# Patient Record
Sex: Male | Born: 1960 | Race: White | Hispanic: No | Marital: Single | Smoking: Current every day smoker
Health system: Southern US, Community
[De-identification: ages and names within clinical notes are randomized; demographics above are authoritative.]

## PROBLEM LIST (undated history)

## (undated) DIAGNOSIS — K219 Gastro-esophageal reflux disease without esophagitis: Secondary | ICD-10-CM

## (undated) DIAGNOSIS — Z973 Presence of spectacles and contact lenses: Secondary | ICD-10-CM

## (undated) DIAGNOSIS — J42 Unspecified chronic bronchitis: Secondary | ICD-10-CM

## (undated) DIAGNOSIS — J439 Emphysema, unspecified: Secondary | ICD-10-CM

## (undated) DIAGNOSIS — C801 Malignant (primary) neoplasm, unspecified: Secondary | ICD-10-CM

## (undated) HISTORY — DX: Malignant (primary) neoplasm, unspecified: C80.1

## (undated) HISTORY — PX: TONSILLECTOMY: SUR1361

## (undated) HISTORY — DX: Emphysema, unspecified: J43.9

## (undated) HISTORY — PX: CERVICAL DISCECTOMY: SHX98

## (undated) HISTORY — PX: COLONOSCOPY: SHX174

---

## 2005-08-03 ENCOUNTER — Ambulatory Visit: Payer: Self-pay

## 2012-12-19 ENCOUNTER — Emergency Department: Payer: Self-pay | Admitting: Emergency Medicine

## 2012-12-19 LAB — COMPREHENSIVE METABOLIC PANEL
BUN: 12 mg/dL (ref 7–18)
Bilirubin,Total: 0.7 mg/dL (ref 0.2–1.0)
Calcium, Total: 8.7 mg/dL (ref 8.5–10.1)
Chloride: 102 mmol/L (ref 98–107)
Co2: 24 mmol/L (ref 21–32)
Creatinine: 0.83 mg/dL (ref 0.60–1.30)
EGFR (Non-African Amer.): >60
Glucose: 88 mg/dL (ref 65–99)
Osmolality: 266 (ref 275–301)
Potassium: 3.6 mmol/L (ref 3.5–5.1)
Total Protein: 7.7 g/dL (ref 6.4–8.2)

## 2012-12-19 LAB — CBC
HCT: 45.2 % (ref 40.0–52.0)
HGB: 16.3 g/dL (ref 13.0–18.0)
MCH: 33.1 pg (ref 26.0–34.0)
MCHC: 35.9 g/dL (ref 32.0–36.0)
MCV: 92 fL (ref 80–100)
Platelet: 215 10*3/uL (ref 150–440)
RBC: 4.91 10*6/uL (ref 4.40–5.90)
RDW: 13.2 % (ref 11.5–14.5)

## 2012-12-19 LAB — URINALYSIS, COMPLETE
Bacteria: NONE SEEN
Bilirubin,UR: NEGATIVE
Blood: NEGATIVE
Glucose,UR: NEGATIVE mg/dL (ref 0–75)
Hyaline Cast: 1
Leukocyte Esterase: NEGATIVE
Nitrite: NEGATIVE
Ph: 6 (ref 4.5–8.0)
Protein: NEGATIVE
RBC,UR: 1 /HPF (ref 0–5)
Specific Gravity: 1.02 (ref 1.003–1.030)
Squamous Epithelial: <1
WBC UR: 1 /HPF (ref 0–5)

## 2013-12-05 ENCOUNTER — Emergency Department: Payer: Self-pay | Admitting: Emergency Medicine

## 2014-01-24 ENCOUNTER — Emergency Department: Payer: Self-pay | Admitting: Emergency Medicine

## 2014-03-01 ENCOUNTER — Ambulatory Visit: Payer: Self-pay | Admitting: Unknown Physician Specialty

## 2014-03-06 LAB — PATHOLOGY REPORT

## 2014-09-05 ENCOUNTER — Other Ambulatory Visit (INDEPENDENT_AMBULATORY_CARE_PROVIDER_SITE_OTHER): Payer: Self-pay | Admitting: Surgery

## 2014-09-16 ENCOUNTER — Encounter (HOSPITAL_BASED_OUTPATIENT_CLINIC_OR_DEPARTMENT_OTHER): Payer: Self-pay | Admitting: *Deleted

## 2014-09-16 NOTE — Progress Notes (Signed)
No labs per anesthesia needed 

## 2014-09-17 NOTE — H&P (Addendum)
Ian Taylor. Gluth 09/05/2014 3:41 PM Location: [facility] Patient #: [id number] DOB: [birth year] Single / Language: [place] / Race: White Male  History of Present Illness Ian Lam A. Magnus Ivan MD; 09/05/2014 4:07 PM) Patient words: hernia.  The patient is a 54 year old male who presents with an inguinal hernia. This gentleman is referred by Dr. Eula Fried for evaluation of a left inguinal hernia. The patient started having pain in his left groin in December 2015. In January, he started having a reducible bulge in the left groin. He has moderate discomfort in the left groin especially after standing all day. The pain is described as sharp. He has had no obstructive symptoms and reports that the hernia will easily reduced. He has no other complaints.   Other Problems Ian Taylor, [place]; 09/05/2014 3:41 PM) Gastroesophageal Reflux Disease  Past Surgical History Ian Taylor, CMA; 09/05/2014 3:41 PM) Tonsillectomy  Diagnostic Studies History Ian Taylor, [place]; 09/05/2014 3:41 PM) Colonoscopy within last year  Allergies Ian Taylor, CMA; 09/05/2014 3:41 PM) No Known Drug Allergies02/18/2016  Medication History Ian Taylor, CMA; 09/05/2014 3:43 PM) Ventolin HFA (108 (90 Base)MCG/ACT Aerosol Soln, Inhalation) Active. Aleve (  Capsule, Oral prn) Active. Probiotic (Oral) Active. Acid Reducer (  Tablet, Oral) Active.  Social History Ian Taylor, [place]; 09/05/2014 3:41 PM) Alcohol use Moderate alcohol use. Caffeine use Tea. Illicit drug use Remotely quit drug use. Tobacco use Current every day smoker.  Family History Ian Taylor, [place]; 09/05/2014 3:41 PM) Family history unknown First Degree Relatives  Review of Systems Ian Taylor CMA; 09/05/2014 3:41 PM) General Not Present- Appetite Loss, Chills, Fatigue, Fever, Night Sweats, Weight Gain and Weight Loss. Skin Not Present- Change in Wart/Mole, Dryness, Hives,  Jaundice, New Lesions, Non-Healing Wounds, Rash and Ulcer. HEENT Not Present- Earache, Hearing Loss, Hoarseness, Nose Bleed, Oral Ulcers, Ringing in the Ears, Seasonal Allergies, Sinus Pain, Sore Throat, Visual Disturbances, Wears glasses/contact lenses and Yellow Eyes. Respiratory Not Present- Bloody sputum, Chronic Cough, Difficulty Breathing, Snoring and Wheezing. Breast Not Present- Breast Mass, Breast Pain, Nipple Discharge and Skin Changes. Cardiovascular Not Present- Chest Pain, Difficulty Breathing Lying Down, Leg Cramps, Palpitations, Rapid Heart Rate, Shortness of Breath and Swelling of Extremities. Gastrointestinal Not Present- Abdominal Pain, Bloating, Bloody Stool, Change in Bowel Habits, Chronic diarrhea, Constipation, Difficulty Swallowing, Excessive gas, Gets full quickly at meals, Hemorrhoids, Indigestion, Nausea, Rectal Pain and Vomiting. Male Genitourinary Not Present- Blood in Urine, Change in Urinary Stream, Frequency, Impotence, Nocturia, Painful Urination, Urgency and Urine Leakage.   Vitals [employer] R. Taylor CMA; 09/05/2014 3:41 PM) 09/05/2014 3:41 PM Weight: 145.38 lb Height: 68in Body Surface Area: 1.78 m Body Mass Index: 22.1 kg/m BP: 108/70 (Sitting, Left Arm, Standard)    Physical Exam (Ian Niblett A. Magnus Ivan MD; 09/05/2014 4:07 PM) General Mental Status-Alert. General Appearance-Consistent with stated age. Hydration-Well hydrated. Voice-Normal.  Head and Neck Head-normocephalic, atraumatic with no lesions or palpable masses. Trachea-midline.  Eye Eyeball - Bilateral-Extraocular movements intact. Sclera/Conjunctiva - Bilateral-No scleral icterus.  Chest and Lung Exam Chest and lung exam reveals -quiet, even and easy respiratory effort with no use of accessory muscles and on auscultation, normal breath sounds, no adventitious sounds and normal vocal resonance. Inspection Chest Wall - Normal. Back -  normal.  Cardiovascular Cardiovascular examination reveals -normal heart sounds, regular rate and rhythm with no murmurs and normal pedal pulses bilaterally.  Abdomen Inspection Skin - Scar - no surgical scars. Hernias - Inguinal hernia - Left - Reducible. Palpation/Percussion Palpation and  Percussion of the abdomen reveal - Soft, Non Tender, No Rebound tenderness, No Rigidity (guarding) and No hepatosplenomegaly. Auscultation Auscultation of the abdomen reveals - Bowel sounds normal.  Neurologic Neurologic evaluation reveals -alert and oriented x 3 with no impairment of recent or remote memory. Mental Status-Normal.  Musculoskeletal Normal Exam - Left-Upper Extremity Strength Normal and Lower Extremity Strength Normal. Normal Exam - Right-Upper Extremity Strength Normal, Lower Extremity Weakness.    Assessment & Plan (Tonnya Garbett A. Magnus IvanBlackman MD; 09/05/2014 4:09 PM) LEFT INGUINAL HERNIA (550.90  K40.90) Impression: I discussed the diagnosis with the patient in detail. He actually also feels weak in the right groin. I would recommend laparoscopic left inguinal hernia repair with mesh which would allow me to evaluate the right inguinal side and repaired as well if there is a hernia. I discussed the risk of surgery which includes but is not limited to bleeding, infection, injury to surrounding structures, nerve entrapment, chronic pain, etc. He understands and wished to proceed with surgery     Signed by Ian Rubensteinouglas A Vannessa Godown, MD (09/05/2014 4:13 PM)

## 2014-09-18 ENCOUNTER — Encounter (HOSPITAL_BASED_OUTPATIENT_CLINIC_OR_DEPARTMENT_OTHER): Payer: Self-pay | Admitting: Certified Registered"

## 2014-09-18 ENCOUNTER — Ambulatory Visit (HOSPITAL_BASED_OUTPATIENT_CLINIC_OR_DEPARTMENT_OTHER): Payer: BLUE CROSS/BLUE SHIELD | Admitting: Certified Registered"

## 2014-09-18 ENCOUNTER — Ambulatory Visit (HOSPITAL_BASED_OUTPATIENT_CLINIC_OR_DEPARTMENT_OTHER)
Admission: RE | Admit: 2014-09-18 | Discharge: 2014-09-18 | Disposition: A | Discharge status: Home / Self Care | Payer: BLUE CROSS/BLUE SHIELD | Source: Ambulatory Visit | Attending: Surgery | Admitting: Surgery

## 2014-09-18 ENCOUNTER — Encounter (HOSPITAL_BASED_OUTPATIENT_CLINIC_OR_DEPARTMENT_OTHER)
Admission: RE | Disposition: A | Discharge status: Home / Self Care | Payer: Self-pay | Source: Ambulatory Visit | Attending: Surgery

## 2014-09-18 DIAGNOSIS — Z79899 Other long term (current) drug therapy: Secondary | ICD-10-CM | POA: Insufficient documentation

## 2014-09-18 DIAGNOSIS — Z791 Long term (current) use of non-steroidal anti-inflammatories (NSAID): Secondary | ICD-10-CM | POA: Insufficient documentation

## 2014-09-18 DIAGNOSIS — Z9089 Acquired absence of other organs: Secondary | ICD-10-CM | POA: Diagnosis not present

## 2014-09-18 DIAGNOSIS — K219 Gastro-esophageal reflux disease without esophagitis: Secondary | ICD-10-CM | POA: Insufficient documentation

## 2014-09-18 DIAGNOSIS — K4090 Unilateral inguinal hernia, without obstruction or gangrene, not specified as recurrent: Secondary | ICD-10-CM | POA: Insufficient documentation

## 2014-09-18 DIAGNOSIS — F17200 Nicotine dependence, unspecified, uncomplicated: Secondary | ICD-10-CM | POA: Diagnosis not present

## 2014-09-18 DIAGNOSIS — F17210 Nicotine dependence, cigarettes, uncomplicated: Secondary | ICD-10-CM | POA: Diagnosis not present

## 2014-09-18 HISTORY — DX: Unspecified chronic bronchitis: J42

## 2014-09-18 HISTORY — DX: Gastro-esophageal reflux disease without esophagitis: K21.9

## 2014-09-18 HISTORY — PX: INSERTION OF MESH: SHX5868

## 2014-09-18 HISTORY — PX: INGUINAL HERNIA REPAIR: SHX194

## 2014-09-18 HISTORY — DX: Presence of spectacles and contact lenses: Z97.3

## 2014-09-18 LAB — POCT HEMOGLOBIN-HEMACUE: HEMOGLOBIN: 16.8 g/dL (ref 13.0–17.0)

## 2014-09-18 SURGERY — REPAIR, HERNIA, INGUINAL, LAPAROSCOPIC
Anesthesia: General | Site: Abdomen

## 2014-09-18 MED ORDER — OXYCODONE HCL 5 MG PO TABS
5.0000 mg | ORAL_TABLET | ORAL | Status: DC | PRN
  Administered 2014-09-18: 5 mg via ORAL

## 2014-09-18 MED ORDER — FENTANYL CITRATE 0.05 MG/ML IJ SOLN: 25.0000 ug | INTRAMUSCULAR | Status: DC | PRN

## 2014-09-18 MED ORDER — DEXAMETHASONE SODIUM PHOSPHATE 4 MG/ML IJ SOLN
INTRAMUSCULAR | Status: DC | PRN
  Administered 2014-09-18: 10 mg via INTRAVENOUS

## 2014-09-18 MED ORDER — MIDAZOLAM HCL 2 MG/2ML IJ SOLN
INTRAMUSCULAR | Status: AC
  Filled 2014-09-18: qty 2

## 2014-09-18 MED ORDER — MIDAZOLAM HCL 2 MG/2ML IJ SOLN: 1.0000 mg | INTRAMUSCULAR | Status: DC | PRN

## 2014-09-18 MED ORDER — FENTANYL CITRATE 0.05 MG/ML IJ SOLN
INTRAMUSCULAR | Status: AC
  Filled 2014-09-18: qty 2

## 2014-09-18 MED ORDER — OXYCODONE-ACETAMINOPHEN 5-325 MG PO TABS: 1.0000 | ORAL_TABLET | ORAL | Status: DC | PRN

## 2014-09-18 MED ORDER — GLYCOPYRROLATE 0.2 MG/ML IJ SOLN
INTRAMUSCULAR | Status: DC | PRN
  Administered 2014-09-18: 0.6 mg via INTRAVENOUS

## 2014-09-18 MED ORDER — PROMETHAZINE HCL 25 MG/ML IJ SOLN: 6.2500 mg | INTRAMUSCULAR | Status: DC | PRN

## 2014-09-18 MED ORDER — FENTANYL CITRATE 0.05 MG/ML IJ SOLN: 50.0000 ug | INTRAMUSCULAR | Status: DC | PRN

## 2014-09-18 MED ORDER — KETOROLAC TROMETHAMINE 30 MG/ML IJ SOLN
INTRAMUSCULAR | Status: DC | PRN
  Administered 2014-09-18: 30 mg via INTRAVENOUS

## 2014-09-18 MED ORDER — CEFAZOLIN SODIUM-DEXTROSE 2-3 GM-% IV SOLR
2.0000 g | INTRAVENOUS | Status: AC
  Administered 2014-09-18: 2 g via INTRAVENOUS

## 2014-09-18 MED ORDER — MIDAZOLAM HCL 5 MG/5ML IJ SOLN
INTRAMUSCULAR | Status: DC | PRN
  Administered 2014-09-18: 2 mg via INTRAVENOUS

## 2014-09-18 MED ORDER — CEFAZOLIN SODIUM-DEXTROSE 2-3 GM-% IV SOLR
INTRAVENOUS | Status: AC
  Filled 2014-09-18: qty 50

## 2014-09-18 MED ORDER — HYDROMORPHONE HCL 1 MG/ML IJ SOLN
0.2500 mg | INTRAMUSCULAR | Status: DC | PRN
  Administered 2014-09-18 (×2): 0.5 mg via INTRAVENOUS

## 2014-09-18 MED ORDER — ONDANSETRON HCL 4 MG/2ML IJ SOLN
INTRAMUSCULAR | Status: DC | PRN
  Administered 2014-09-18: 4 mg via INTRAVENOUS

## 2014-09-18 MED ORDER — ROCURONIUM BROMIDE 100 MG/10ML IV SOLN
INTRAVENOUS | Status: DC | PRN
  Administered 2014-09-18: 30 mg via INTRAVENOUS

## 2014-09-18 MED ORDER — ACETAMINOPHEN 325 MG PO TABS: 650.0000 mg | ORAL_TABLET | ORAL | Status: DC | PRN

## 2014-09-18 MED ORDER — OXYCODONE HCL 5 MG PO TABS
ORAL_TABLET | ORAL | Status: AC
  Filled 2014-09-18: qty 1

## 2014-09-18 MED ORDER — PROPOFOL 10 MG/ML IV BOLUS
INTRAVENOUS | Status: DC | PRN
  Administered 2014-09-18: 200 mg via INTRAVENOUS

## 2014-09-18 MED ORDER — HYDROMORPHONE HCL 1 MG/ML IJ SOLN
INTRAMUSCULAR | Status: AC
  Filled 2014-09-18: qty 1

## 2014-09-18 MED ORDER — FENTANYL CITRATE 0.05 MG/ML IJ SOLN
INTRAMUSCULAR | Status: AC
Start: 2014-09-18 — End: 2014-09-18
  Filled 2014-09-18: qty 4

## 2014-09-18 MED ORDER — LIDOCAINE HCL (CARDIAC) 20 MG/ML IV SOLN
INTRAVENOUS | Status: DC | PRN
  Administered 2014-09-18: 60 mg via INTRAVENOUS

## 2014-09-18 MED ORDER — SODIUM CHLORIDE 0.9 % IJ SOLN: 3.0000 mL | INTRAMUSCULAR | Status: DC | PRN

## 2014-09-18 MED ORDER — ACETAMINOPHEN 650 MG RE SUPP: 650.0000 mg | RECTAL | Status: DC | PRN

## 2014-09-18 MED ORDER — BUPIVACAINE-EPINEPHRINE (PF) 0.5% -1:200000 IJ SOLN
INTRAMUSCULAR | Status: DC | PRN
  Administered 2014-09-18: 20 mL

## 2014-09-18 MED ORDER — FENTANYL CITRATE 0.05 MG/ML IJ SOLN
INTRAMUSCULAR | Status: DC | PRN
  Administered 2014-09-18: 50 ug via INTRAVENOUS

## 2014-09-18 MED ORDER — LACTATED RINGERS IV SOLN
INTRAVENOUS | Status: DC
  Administered 2014-09-18 (×2): via INTRAVENOUS

## 2014-09-18 MED ORDER — NEOSTIGMINE METHYLSULFATE 10 MG/10ML IV SOLN
INTRAVENOUS | Status: DC | PRN
  Administered 2014-09-18: 4 mg via INTRAVENOUS

## 2014-09-18 MED ORDER — EPHEDRINE SULFATE 50 MG/ML IJ SOLN
INTRAMUSCULAR | Status: DC | PRN
  Administered 2014-09-18: 10 mg via INTRAVENOUS

## 2014-09-18 MED ORDER — SODIUM CHLORIDE 0.9 % IJ SOLN: 3.0000 mL | Freq: Two times a day (BID) | INTRAMUSCULAR | Status: DC

## 2014-09-18 MED ORDER — SODIUM CHLORIDE 0.9 % IV SOLN: 250.0000 mL | INTRAVENOUS | Status: DC | PRN

## 2014-09-18 SURGICAL SUPPLY — 31 items
APPLIER CLIP LOGIC TI 5 (MISCELLANEOUS) IMPLANT
BANDAGE ADH SHEER 1  50/CT (GAUZE/BANDAGES/DRESSINGS) ×12 IMPLANT
BENZOIN TINCTURE PRP APPL 2/3 (GAUZE/BANDAGES/DRESSINGS) ×4 IMPLANT
BLADE CLIPPER SURG (BLADE) IMPLANT
CHLORAPREP W/TINT 26ML (MISCELLANEOUS) ×4 IMPLANT
CLOSURE WOUND 1/2 X4 (GAUZE/BANDAGES/DRESSINGS) ×1
DEVICE SECURE STRAP 25 ABSORB (INSTRUMENTS) ×4 IMPLANT
DISSECT BALLN SPACEMKR + OVL (BALLOONS) ×4
DISSECTOR BALLN SPACEMKR + OVL (BALLOONS) ×2 IMPLANT
DISSECTOR BLUNT TIP ENDO 5MM (MISCELLANEOUS) IMPLANT
ELECT REM PT RETURN 9FT ADLT (ELECTROSURGICAL) ×4
ELECTRODE REM PT RTRN 9FT ADLT (ELECTROSURGICAL) ×2 IMPLANT
GLOVE SURG SIGNA 7.5 PF LTX (GLOVE) ×4 IMPLANT
GOWN STRL REUS W/ TWL LRG LVL3 (GOWN DISPOSABLE) ×6 IMPLANT
GOWN STRL REUS W/ TWL XL LVL3 (GOWN DISPOSABLE) ×2 IMPLANT
GOWN STRL REUS W/TWL LRG LVL3 (GOWN DISPOSABLE) ×6
GOWN STRL REUS W/TWL XL LVL3 (GOWN DISPOSABLE) ×2
MESH 3DMAX 4X6 LT LRG (Mesh General) ×4 IMPLANT
NEEDLE INSUFFLATION 14GA 120MM (NEEDLE) IMPLANT
NS IRRIG 1000ML POUR BTL (IV SOLUTION) ×4 IMPLANT
PACK BASIN DAY SURGERY FS (CUSTOM PROCEDURE TRAY) ×4 IMPLANT
SCISSORS LAP 5X35 DISP (ENDOMECHANICALS) ×4 IMPLANT
SET IRRIG TUBING LAPAROSCOPIC (IRRIGATION / IRRIGATOR) IMPLANT
SET TROCAR LAP APPLE-HUNT 5MM (ENDOMECHANICALS) ×4 IMPLANT
SLEEVE SCD COMPRESS KNEE MED (MISCELLANEOUS) ×4 IMPLANT
STRIP CLOSURE SKIN 1/2X4 (GAUZE/BANDAGES/DRESSINGS) ×3 IMPLANT
SUT MNCRL AB 4-0 PS2 18 (SUTURE) ×4 IMPLANT
TOWEL OR 17X24 6PK STRL BLUE (TOWEL DISPOSABLE) ×4 IMPLANT
TRAY FOLEY CATH 16FR SILVER (SET/KITS/TRAYS/PACK) ×4 IMPLANT
TRAY LAPAROSCOPIC (CUSTOM PROCEDURE TRAY) ×4 IMPLANT
TUBING INSUFFLATION (TUBING) ×4 IMPLANT

## 2014-09-18 NOTE — Anesthesia Postprocedure Evaluation (Signed)
Anesthesia Post Note  Patient: Ian Taylor  Procedure(s) Performed: Procedure(s) (LRB): LAPAROSCOPIC LEFT, POSSIBLE RIGHT INGUINAL HERNIA REPAIR WITH MESH (Left) INSERTION OF MESH (N/A)  Anesthesia type: General  Patient location: PACU  Post pain: Pain level controlled and Adequate analgesia  Post assessment: Post-op Vital signs reviewed, Patient's Cardiovascular Status Stable, Respiratory Function Stable, Patent Airway and Pain level controlled  Last Vitals:  Filed Vitals:   09/18/14 1033  BP: 134/80  Pulse: 85  Temp: 35.9 C  Resp: 14    Post vital signs: Reviewed and stable  Level of consciousness: awake, alert  and oriented  Complications: No apparent anesthesia complications

## 2014-09-18 NOTE — Anesthesia Procedure Notes (Signed)
Procedure Name: Intubation Date/Time: 09/18/2014 9:54 AM Performed by: Elex Mainwaring Pre-anesthesia Checklist: Patient identified, Emergency Drugs available, Suction available and Patient being monitored Patient Re-evaluated:Patient Re-evaluated prior to inductionOxygen Delivery Method: Circle System Utilized Preoxygenation: Pre-oxygenation with 100% oxygen Intubation Type: IV induction Ventilation: Mask ventilation without difficulty Laryngoscope Size: Mac and 3 Grade View: Grade I Tube type: Oral Tube size: 7.0 mm Number of attempts: 1 Airway Equipment and Method: Stylet Placement Confirmation: ETT inserted through vocal cords under direct vision,  positive ETCO2 and breath sounds checked- equal and bilateral Tube secured with: Tape Dental Injury: Teeth and Oropharynx as per pre-operative assessment

## 2014-09-18 NOTE — Transfer of Care (Signed)
Immediate Anesthesia Transfer of Care Note  Patient: Ian LamingKenneth M Weaver  Procedure(s) Performed: Procedure(s): LAPAROSCOPIC LEFT, POSSIBLE RIGHT INGUINAL HERNIA REPAIR WITH MESH (Left) INSERTION OF MESH (N/A)  Patient Location: PACU  Anesthesia Type:General  Level of Consciousness: awake, alert , oriented and patient cooperative  Airway & Oxygen Therapy: Patient Spontanous Breathing and Patient connected to face mask oxygen  Post-op Assessment: Report given to RN and Post -op Vital signs reviewed and stable  Post vital signs: Reviewed and stable  Last Vitals:  Filed Vitals:   09/18/14 1130  BP: 101/73  Pulse: 74  Temp:   Resp: 18    Complications: No apparent anesthesia complications

## 2014-09-18 NOTE — Anesthesia Preprocedure Evaluation (Addendum)
Anesthesia Evaluation  Patient identified by MRN, date of birth, ID band Patient awake    Reviewed: Allergy & Precautions, NPO status , Patient's Chart, lab work & pertinent test results  Airway Mallampati: II  TM Distance: >3 FB Neck ROM: Full    Dental   Pulmonary Current Smoker,  breath sounds clear to auscultation        Cardiovascular negative cardio ROS  Rhythm:Regular Rate:Normal     Neuro/Psych    GI/Hepatic Neg liver ROS, GERD-  Medicated,  Endo/Other  negative endocrine ROS  Renal/GU negative Renal ROS     Musculoskeletal   Abdominal   Peds  Hematology   Anesthesia Other Findings   Reproductive/Obstetrics                           Anesthesia Physical Anesthesia Plan  ASA: II  Anesthesia Plan: General   Post-op Pain Management:    Induction: Intravenous  Airway Management Planned: Oral ETT  Additional Equipment:   Intra-op Plan:   Post-operative Plan: Extubation in OR  Informed Consent: I have reviewed the patients History and Physical, chart, labs and discussed the procedure including the risks, benefits and alternatives for the proposed anesthesia with the patient or authorized representative who has indicated his/her understanding and acceptance.   Dental advisory given  Plan Discussed with: CRNA, Anesthesiologist and Surgeon  Anesthesia Plan Comments:         Anesthesia Quick Evaluation

## 2014-09-18 NOTE — Op Note (Signed)
LAPAROSCOPIC LEFT, POSSIBLE RIGHT INGUINAL HERNIA REPAIR WITH MESH, INSERTION OF MESH  Procedure Note  Ian LamingKenneth M Taylor 09/18/2014   Pre-op Diagnosis: Left Inguinal Hernia     Post-op Diagnosis: same  Procedure(s): LAPAROSCOPIC LEFT, POSSIBLE RIGHT INGUINAL HERNIA REPAIR WITH MESH INSERTION OF MESH  Surgeon(s): Abigail Miyamotoouglas Aleta Manternach, MD  Anesthesia: General  Staff:  Circulator: Jay Schlichteronnie Jo Bouchillon, RN Relief Circulator: Joylene GrapesJorge A Garzon, RN Relief Scrub: Salley ScarletMary B Davidson, RN Scrub Person: Hurshel PartyAngela Maria Witt, Company secretaryCST Circulator Assistant: Salley ScarletMary B Davidson, RN  Estimated Blood Loss: Minimal                         [name]   Date: 09/18/2014  Time: 10:32 AM

## 2014-09-18 NOTE — Discharge Instructions (Signed)
[facility] ______[facility] °LAPAROSCOPIC SURGERY: POST OP INSTRUCTIONS °Always review your discharge instruction sheet given to you by the facility where your surgery was performed. °IF YOU HAVE DISABILITY OR FAMILY LEAVE FORMS, YOU MUST BRING THEM TO THE OFFICE FOR PROCESSING.   °DO NOT GIVE THEM TO YOUR DOCTOR. ° °1. A prescription for pain medication may be given to you upon discharge.  Take your pain medication as prescribed, if needed.  If narcotic pain medicine is not needed, then you may take acetaminophen (Tylenol) or ibuprofen (Advil) as needed. °2. Take your usually prescribed medications unless otherwise directed. °3. If you need a refill on your pain medication, please contact your pharmacy.  They will contact our office to request authorization. Prescriptions will not be filled after 5pm or on week-ends. °4. You should follow a light diet the first few days after arrival home, such as soup and crackers, etc.  Be sure to include lots of fluids daily. °5. Most patients will experience some swelling and bruising in the area of the incisions.  Ice packs will help.  Swelling and bruising can take several days to resolve.  °6. It is common to experience some constipation if taking pain medication after surgery.  Increasing fluid intake and taking a stool softener (such as Colace) will usually help or prevent this problem from occurring.  A mild laxative (Milk of Magnesia or Miralax) should be taken according to package instructions if there are no bowel movements after 48 hours. °7. Unless discharge instructions indicate otherwise, you may remove your bandages 24-48 hours after surgery, and you may shower at that time.  You may have steri-strips (small skin tapes) in place directly over the incision.  These strips should be left on the skin for 7-10 days.  If your surgeon used skin glue on the incision, you may shower in 24 hours.  The glue will flake off over the next 2-3 weeks.  Any sutures or  staples will be removed at the office during your follow-up visit. °8. ACTIVITIES:  You may resume regular (light) daily activities beginning the next day--such as daily self-care, walking, climbing stairs--gradually increasing activities as tolerated.  You may have sexual intercourse when it is comfortable.  Refrain from any heavy lifting or straining until approved by your doctor. °a. You may drive when you are no longer taking prescription pain medication, you can comfortably wear a seatbelt, and you can safely maneuver your car and apply brakes. °b. RETURN TO WORK:  __________________________________________________________ °9. You should see your doctor in the office for a follow-up appointment approximately 2-3 weeks after your surgery.  Make sure that you call for this appointment within a day or two after you arrive home to insure a convenient appointment time. °10. OTHER INSTRUCTIONS: __NO LIFTING MORE THAN 15 POUNDS FOR 2 WEEKS. °11. ICE PACK AND IBUPROFEN ALSO FOR PAIN. °12. ________________________________________________________________________________________________________________________ __________________________________________________________________________________________________________________________ °WHEN TO CALL YOUR DOCTOR: °1. Fever over 101.0 °2. Inability to urinate °3. Continued bleeding from incision. °4. Increased pain, redness, or drainage from the incision. °5. Increasing abdominal pain ° °The clinic staff is available to answer your questions during regular business hours.  Please don’t hesitate to call and ask to speak to one of the nurses for clinical concerns.  If you have a medical emergency, go to the nearest emergency room or call 911.  A surgeon from [facility] is always on call at the hospital. °[address], [address], [place], [place]  [address] ?   [id number], [place], [place]   [address] [phone] ? [phone] ? FAX [phone] Web site:  [email or web address]   Post Anesthesia Home Care Instructions  Activity: Get plenty of rest for the remainder of the day. A responsible adult should stay with you for 24 hours following the procedure.  For the next 24 hours, DO NOT: -Drive a car -Paediatric nurse -Drink alcoholic beverages -Take any medication unless instructed by your physician -Make any legal decisions or sign important papers.  Meals: Start with liquid foods such as gelatin or soup. Progress to regular foods as tolerated. Avoid greasy, spicy, heavy foods. If nausea and/or vomiting occur, drink only clear liquids until the nausea and/or vomiting subsides. Call your physician if vomiting continues.  Special Instructions/Symptoms: Your throat may feel dry or sore from the anesthesia or the breathing tube placed in your throat during surgery. If this causes discomfort, gargle with warm salt water. The discomfort should disappear within 24 hours.

## 2014-09-18 NOTE — Interval H&P Note (Signed)
History and Physical Interval Note:no change in h and P  09/18/2014 9:36 AM  Ian LamingKenneth M Taylor  has presented today for surgery, with the diagnosis of Left Inguinal Hernia  The various methods of treatment have been discussed with the patient and family. After consideration of risks, benefits and other options for treatment, the patient has consented to  Procedure(s): LAPAROSCOPIC LEFT, POSSIBLE RIGHT INGUINAL HERNIA REPAIR WITH MESH (Left) INSERTION OF MESH (N/A) as a surgical intervention .  The patient's history has been reviewed, patient examined, no change in status, stable for surgery.  I have reviewed the patient's chart and labs.  Questions were answered to the patient's satisfaction.     Kimmberly Wisser A

## 2014-09-19 ENCOUNTER — Encounter (HOSPITAL_BASED_OUTPATIENT_CLINIC_OR_DEPARTMENT_OTHER): Payer: Self-pay | Admitting: Surgery

## 2014-09-19 NOTE — Op Note (Signed)
NAME:  Ian Taylor, Ian Taylor            ACCOUNT NO.:  [id number]  MEDICAL RECORD NO.:  [id number]  LOCATION:                                FACILITY:  [facility]  PHYSICIAN:  Abigail Miyamotoouglas Jaques Mineer, M.D.      DATE OF BIRTH:  DATE OF PROCEDURE:  09/18/2014 DATE OF DISCHARGE:  09/18/2014                              OPERATIVE REPORT   PREOPERATIVE DIAGNOSIS:  Left inguinal hernia.  POSTOPERATIVE DIAGNOSIS:  Left inguinal hernia.  PROCEDURE:  Laparoscopic left inguinal hernia repair with mesh.  SURGEON:  Abigail Miyamotoouglas Whittney Steenson, MD  ANESTHESIA:  General and 0.5% Marcaine.  ESTIMATED BLOOD LOSS:  Minimal.  FINDINGS:  The patient was found to have an indirect left inguinal hernia.  I saw no evidence of right-sided inguinal hernia.  PROCEDURE IN DETAIL:  The patient was brought to the operating room, identified as Ian Taylor.  He was placed supine on the operating room table and general anesthesia was induced.  His Foley catheter was inserted.  His abdomen was then prepped and draped in usual sterile fashion.  Using a #15 blade, a small vertical incision was made below the umbilicus.  This was carried down to the fascia which I opened just to the left of the midline.  The dissecting balloon was then passed underneath the rectus muscle and toward the pubis.  The dissecting balloon was then insufflated under direct vision, dissecting out the preperitoneal space.  I then removed the dissecting balloon and then insufflation of the arm with carbon dioxide.  Two 5 mm ports were then placed in patient's lower midline under direct vision.  I dissected out the right inguinal area first.  The patient had an indirect hernia sac which I was able to easily reduce from the cord structures.  There was no evidence of indirect hernia sac.  I then evaluated the right side and saw no evidence of right inguinal hernia.  Next, a piece of Bard 3DMax Prolene mesh was brought onto the field.  I used a large piece of  mesh. It was placed through the camera port at the umbilicus, then I opened as an onlay on the left inguinal floor.  Then using the absorbable tackers, I tacked it to Cooper ligament up the medial abdominal wall and slightly laterally.  Good coverage of the inguinal cord and defect appeared to be achieved.  At this point, hemostasis also appeared to be achieved.  The midline ports were removed and the preperitoneal spaces seen to collapse appropriately with the mesh in place.  I then removed the ports all together.  I closed the fascia at the umbilicus with a figure-of-eight 0 Vicryl suture.  All incisions were anesthetized with Marcaine.  I performed a left ilioinguinal nerve block with Marcaine as well.  The patient tolerated procedure well.  All counts were correct at the end of the procedure.  The patient was then extubated in the operating room and taken in stable condition to recovery room.     Abigail Miyamotoouglas Ethyn Schetter, M.D.     DB/MEDQ  D:  09/18/2014  T:  09/18/2014  Job:  [id number]

## 2014-09-25 ENCOUNTER — Encounter (HOSPITAL_BASED_OUTPATIENT_CLINIC_OR_DEPARTMENT_OTHER): Payer: Self-pay | Admitting: Surgery

## 2015-09-29 ENCOUNTER — Encounter: Payer: Self-pay | Admitting: Emergency Medicine

## 2015-09-29 ENCOUNTER — Emergency Department
Admission: EM | Admit: 2015-09-29 | Discharge: 2015-09-29 | Disposition: A | Discharge status: Home / Self Care | Payer: BLUE CROSS/BLUE SHIELD | Attending: Emergency Medicine | Admitting: Emergency Medicine

## 2015-09-29 ENCOUNTER — Emergency Department: Payer: BLUE CROSS/BLUE SHIELD

## 2015-09-29 DIAGNOSIS — Z79899 Other long term (current) drug therapy: Secondary | ICD-10-CM | POA: Diagnosis not present

## 2015-09-29 DIAGNOSIS — Z791 Long term (current) use of non-steroidal anti-inflammatories (NSAID): Secondary | ICD-10-CM | POA: Diagnosis not present

## 2015-09-29 DIAGNOSIS — F17200 Nicotine dependence, unspecified, uncomplicated: Secondary | ICD-10-CM | POA: Insufficient documentation

## 2015-09-29 DIAGNOSIS — J209 Acute bronchitis, unspecified: Secondary | ICD-10-CM

## 2015-09-29 DIAGNOSIS — R0602 Shortness of breath: Secondary | ICD-10-CM | POA: Diagnosis present

## 2015-09-29 LAB — RAPID INFLUENZA A&B ANTIGENS
Influenza A (ARMC): NEGATIVE
Influenza B (ARMC): NEGATIVE

## 2015-09-29 MED ORDER — PREDNISONE 20 MG PO TABS: 40.0000 mg | ORAL_TABLET | Freq: Every day | ORAL | Status: AC

## 2015-09-29 MED ORDER — ALBUTEROL SULFATE HFA 108 (90 BASE) MCG/ACT IN AERS: 2.0000 | INHALATION_SPRAY | Freq: Four times a day (QID) | RESPIRATORY_TRACT | Status: DC | PRN

## 2015-09-29 MED ORDER — HYDROCOD POLST-CPM POLST ER 10-8 MG/5ML PO SUER: 5.0000 mL | Freq: Two times a day (BID) | ORAL | Status: AC | PRN

## 2015-09-29 MED ORDER — IPRATROPIUM-ALBUTEROL 0.5-2.5 (3) MG/3ML IN SOLN
3.0000 mL | Freq: Once | RESPIRATORY_TRACT | Status: AC
  Administered 2015-09-29: 3 mL via RESPIRATORY_TRACT
  Filled 2015-09-29: qty 3

## 2015-09-29 MED ORDER — PREDNISONE 20 MG PO TABS
60.0000 mg | ORAL_TABLET | Freq: Once | ORAL | Status: AC
  Administered 2015-09-29: 60 mg via ORAL
  Filled 2015-09-29: qty 3

## 2015-09-29 NOTE — Discharge Instructions (Signed)

## 2015-09-29 NOTE — ED Provider Notes (Signed)
Time Seen: Approximately 1220 I have reviewed the triage notes  Chief Complaint: Shortness of Breath   History of Present Illness: Ian Taylor is a 55 y.o. male who presents with shortness of breath with a history of COPD. The patient states he still on antibiotic therapy. He states that he is currently on amoxicillinprescribed by the work Publishing rights manager. Patient states he's had a persistent cough, shortness of breath, etc. He denies any chest pain. He states is more difficulty breathing. Patient declined on some laboratory work in the triage area and also declines on laboratory work here in the main emergency department. Patient's not aware of any productive nature to his cough.   Past Medical History  Diagnosis Date  . GERD (gastroesophageal reflux disease)   . Wears glasses   . Chronic bronchitis (HCC)     There are no active problems to display for this patient.   Past Surgical History  Procedure Laterality Date  . Colonoscopy    . Cervical discectomy    . Tonsillectomy    . Inguinal hernia repair Left 09/18/2014    Procedure: LAPAROSCOPIC LEFT INGUINAL HERNIA REPAIR WITH MESH;  Surgeon: Abigail Miyamoto, MD;  Location: [facility];  Service: General;  Laterality: Left;  . Insertion of mesh N/A 09/18/2014    Procedure: INSERTION OF MESH;  Surgeon: Abigail Miyamoto, MD;  Location: [facility];  Service: General;  Laterality: N/A;    Past Surgical History  Procedure Laterality Date  . Colonoscopy    . Cervical discectomy    . Tonsillectomy    . Inguinal hernia repair Left 09/18/2014    Procedure: LAPAROSCOPIC LEFT INGUINAL HERNIA REPAIR WITH MESH;  Surgeon: Abigail Miyamoto, MD;  Location: [facility];  Service: General;  Laterality: Left;  . Insertion of mesh N/A 09/18/2014    Procedure: INSERTION OF MESH;  Surgeon: Abigail Miyamoto, MD;  Location: [facility];  Service: General;  Laterality: N/A;    Current  Outpatient Rx  Name  Route  Sig  Dispense  Refill  . acetaminophen (TYLENOL) 325 MG tablet   Oral   Take 650 mg by mouth every 6 (six) hours as needed.         Marland Kitchen albuterol (PROVENTIL HFA;VENTOLIN HFA) 108 (90 Base) MCG/ACT inhaler   Inhalation   Inhale 2 puffs into the lungs every 6 (six) hours as needed for wheezing or shortness of breath.   1 Inhaler   2   . chlorpheniramine-HYDROcodone (TUSSIONEX PENNKINETIC ER) 10-8 MG/5ML SUER   Oral   Take 5 mLs by mouth every 12 (twelve) hours as needed for cough.   50 mL   0   . naproxen sodium (ANAPROX) 220 MG tablet   Oral   Take 220 mg by mouth 2 (two) times daily with a meal.         . oxyCODONE-acetaminophen (ROXICET) 5-325 MG per tablet   Oral   Take 1-2 tablets by mouth every 4 (four) hours as needed for severe pain.   30 tablet   0   . predniSONE (DELTASONE) 20 MG tablet   Oral   Take 2 tablets (40 mg total) by mouth daily.   10 tablet   0   . Probiotic Product (PROBIOTIC & ACIDOPHILUS EX ST) CAPS   Oral   Take by mouth 2 (two) times daily.           Allergies:  Review of patient's allergies indicates no known  allergies.  Family History: No family history on file.  Social History: Social History  Substance Use Topics  . Smoking status: Current Every Day Smoker -- 1.00 packs/day  . Smokeless tobacco: None  . Alcohol Use: Yes     Comment: occ     Review of Systems:   10 point review of systems was performed and was otherwise negative:  Constitutional: No fever Eyes: No visual disturbances ENT: No sore throat, ear pain Cardiac: No chest pain Respiratory: Shortness of breath without wheezing or strider home Abdomen: No abdominal pain, no vomiting, No diarrhea Endocrine: No weight loss, No night sweats Extremities: No peripheral edema, cyanosis Skin: No rashes, easy bruising Neurologic: No focal weakness, trouble with speech or swollowing Urologic: No dysuria, Hematuria, or urinary  frequency   Physical Exam:  ED Triage Vitals  Enc Vitals Group     BP 09/29/15 1115 131/78 mmHg     Pulse Rate 09/29/15 1114 94     Resp 09/29/15 1114 22     Temp --      Temp Source 09/29/15 1114 Oral     SpO2 09/29/15 1114 98 %     Weight 09/29/15 1114 150 lb (68.04 kg)     Height 09/29/15 1114 5\' 8"  (1.727 m)     Head Cir --      Peak Flow --      Pain Score 09/29/15 1114 6     Pain Loc --      Pain Edu? --      Excl. in GC? --     General: Awake , Alert , and Oriented times 3; GCS 15 mild respiratory distress. Speaks in interrupted sentences Head: Normal cephalic , atraumatic Eyes: Pupils equal , round, reactive to light Nose/Throat: No nasal drainage, patent upper airway without erythema or exudate.  Neck: Supple, Full range of motion, No anterior adenopathy or palpable thyroid masses Lungs: And expiratory wheezing heard primarily at the apices with limited air movement at the bases Heart: Regular rate, regular rhythm without murmurs , gallops , or rubs Abdomen: Soft, non tender without rebound, guarding , or rigidity; bowel sounds positive and symmetric in all 4 quadrants. No organomegaly .        Extremities: 2 plus symmetric pulses. No edema, clubbing or cyanosis Neurologic: normal ambulation, Motor symmetric without deficits, sensory intact Skin: warm, dry, no rashes   Labs:   All laboratory work was reviewed including any pertinent negatives or positives listed below:  Labs Reviewed  RAPID INFLUENZA A&B ANTIGENS ([facility] ONLY)   influenza test was negative  EKG: ED ECG REPORT I, Jennye MoccasinBrian S Liandra Mendia, the attending physician, personally viewed and interpreted this ECG.  Date: 09/29/2015 EKG Time: 1117 Rate: 95 Rhythm: normal sinus rhythm QRS Axis: normal Intervals: normal ST/T Wave abnormalities: normal Conduction Disturbances: none Narrative Interpretation: unremarkable No acute ischemic changes   Radiology:   Narrative:    CLINICAL DATA: Shortness  of breath, chest pain, and productive cough.  EXAM: CHEST 2 VIEW  COMPARISON: 08/03/2005  FINDINGS: The lungs are hyperinflated with emphysematous changes bilaterally. Heart size and vascularity are normal. No infiltrates or effusions. No significant osseous abnormality.  IMPRESSION: No acute abnormality. Emphysema.   Electronically Signed By: Francene BoyersJames Maxwell M.D. On: 09/29/2015 12:20         I personally reviewed the radiologic studies    ED Course:  Patient's stay here showed symptomatic improvement after DuoNeb and initiation of steroid therapy. Patient was prescribed cough medication, inhaler,  and prednisone on an outpatient basis. 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    Assessment:  Acute bronchitis with bronchospasm   Final Clinical Impression:   Final diagnoses:  Bronchitis, acute, with bronchospasm     Plan:  Outpatient management Patient was advised to return immediately if condition worsens. Patient was advised to follow up with their primary care physician or other specialized physicians involved in their outpatient care             Jennye Moccasin, MD 09/29/15 2130

## 2015-09-29 NOTE — ED Notes (Signed)
Pt states he has been diagnosed with a sinus infection, for which he is still on antibiotics.  However, for the last day he has been more short of breath and having difficulty sleeping.   Pt states "I've had pneumonia and that is what this feels like".  Pt O2 saturations are adequate on room air at this time, but patient has dyspnea at rest.

## 2015-09-29 NOTE — ED Notes (Signed)
Pt presents with shortness of breath and chest pain when coughing since Saturday.

## 2015-09-29 NOTE — ED Notes (Signed)
Pt does not want to have labs drawn out in triage at this time.

## 2018-10-25 ENCOUNTER — Other Ambulatory Visit: Payer: Self-pay | Admitting: Physician Assistant

## 2018-10-25 DIAGNOSIS — J449 Chronic obstructive pulmonary disease, unspecified: Secondary | ICD-10-CM

## 2018-10-27 ENCOUNTER — Ambulatory Visit
Admission: RE | Admit: 2018-10-27 | Discharge: 2018-10-27 | Disposition: A | Discharge status: Home / Self Care | Payer: BLUE CROSS/BLUE SHIELD | Source: Ambulatory Visit | Attending: Physician Assistant | Admitting: Physician Assistant

## 2018-10-27 ENCOUNTER — Other Ambulatory Visit: Payer: Self-pay | Admitting: Physician Assistant

## 2018-10-27 ENCOUNTER — Other Ambulatory Visit: Payer: Self-pay

## 2018-10-27 DIAGNOSIS — J449 Chronic obstructive pulmonary disease, unspecified: Secondary | ICD-10-CM

## 2018-10-30 ENCOUNTER — Other Ambulatory Visit: Payer: Self-pay | Admitting: Physician Assistant

## 2018-10-30 DIAGNOSIS — R918 Other nonspecific abnormal finding of lung field: Secondary | ICD-10-CM

## 2018-11-07 ENCOUNTER — Ambulatory Visit
Admission: RE | Admit: 2018-11-07 | Discharge: 2018-11-07 | Disposition: A | Discharge status: Home / Self Care | Payer: BLUE CROSS/BLUE SHIELD | Source: Ambulatory Visit | Attending: Physician Assistant | Admitting: Physician Assistant

## 2018-11-07 ENCOUNTER — Other Ambulatory Visit: Payer: Self-pay

## 2018-11-07 DIAGNOSIS — R918 Other nonspecific abnormal finding of lung field: Secondary | ICD-10-CM | POA: Diagnosis not present

## 2018-11-07 MED ORDER — IOHEXOL 300 MG/ML  SOLN
75.0000 mL | Freq: Once | INTRAMUSCULAR | Status: AC | PRN
  Administered 2018-11-07: 75 mL via INTRAVENOUS

## 2020-04-18 ENCOUNTER — Other Ambulatory Visit: Payer: Self-pay | Admitting: Oncology

## 2020-04-18 DIAGNOSIS — U071 COVID-19: Secondary | ICD-10-CM

## 2020-04-18 NOTE — Progress Notes (Signed)
I connected by phone with  Mr. Bluemel to discuss the potential use of an new treatment for mild to moderate COVID-19 viral infection in non-hospitalized patients.   This patient is a age/sex that meets the FDA criteria for Emergency Use Authorization of casirivimab\imdevimab.  Has a (+) direct SARS-CoV-2 viral test result 1. Has mild or moderate COVID-19  2. Is ? 59 years of age and weighs ? 40 kg 3. Is NOT hospitalized due to COVID-19 4. Is NOT requiring oxygen therapy or requiring an increase in baseline oxygen flow rate due to COVID-19 5. Is within 10 days of symptom onset 6. Has at least one of the high risk factor(s) for progression to severe COVID-19 and/or hospitalization as defined in EUA. Specific high risk criteria : Past Medical History:  Diagnosis Date  . Chronic bronchitis (HCC)   . GERD (gastroesophageal reflux disease)   . Wears glasses   ?  ? Active tx for cancer    Symptom onset  04/10/20   I have spoken and communicated the following to the patient or parent/caregiver:   1. FDA has authorized the emergency use of casirivimab\imdevimab for the treatment of mild to moderate COVID-19 in adults and pediatric patients with positive results of direct SARS-CoV-2 viral testing who are 56 years of age and older weighing at least 40 kg, and who are at high risk for progressing to severe COVID-19 and/or hospitalization.   2. The significant known and potential risks and benefits of casirivimab\imdevimab, and the extent to which such potential risks and benefits are unknown.   3. Information on available alternative treatments and the risks and benefits of those alternatives, including clinical trials.   4. Patients treated with casirivimab\imdevimab should continue to self-isolate and use infection control measures (e.g., wear mask, isolate, social distance, avoid sharing personal items, clean and disinfect "high touch" surfaces, and frequent handwashing) according to CDC  guidelines.    5. The patient or parent/caregiver has the option to accept or refuse casirivimab\imdevimab .   After reviewing this information with the patient, The patient agreed to proceed with receiving casirivimab\imdevimab infusion and will be provided a copy of the Fact sheet prior to receiving the infusion.Mignon Pine, AGNP-C [phone] (Infusion Center Hotline)

## 2020-04-19 ENCOUNTER — Ambulatory Visit (HOSPITAL_COMMUNITY): Payer: BLUE CROSS/BLUE SHIELD

## 2020-07-19 IMAGING — CR CHEST - 2 VIEW
1 series · 2 of 2 positions shown · non-contrast
Comparison: Radiographs September 29, 2015.

CLINICAL DATA: Chronic obstructive pulmonary disease.

EXAM:
CHEST - 2 VIEW

[Series 1: dg chest 2 view · 0.14mm/px · 2 of 2 slices shown]
[im 1/2]
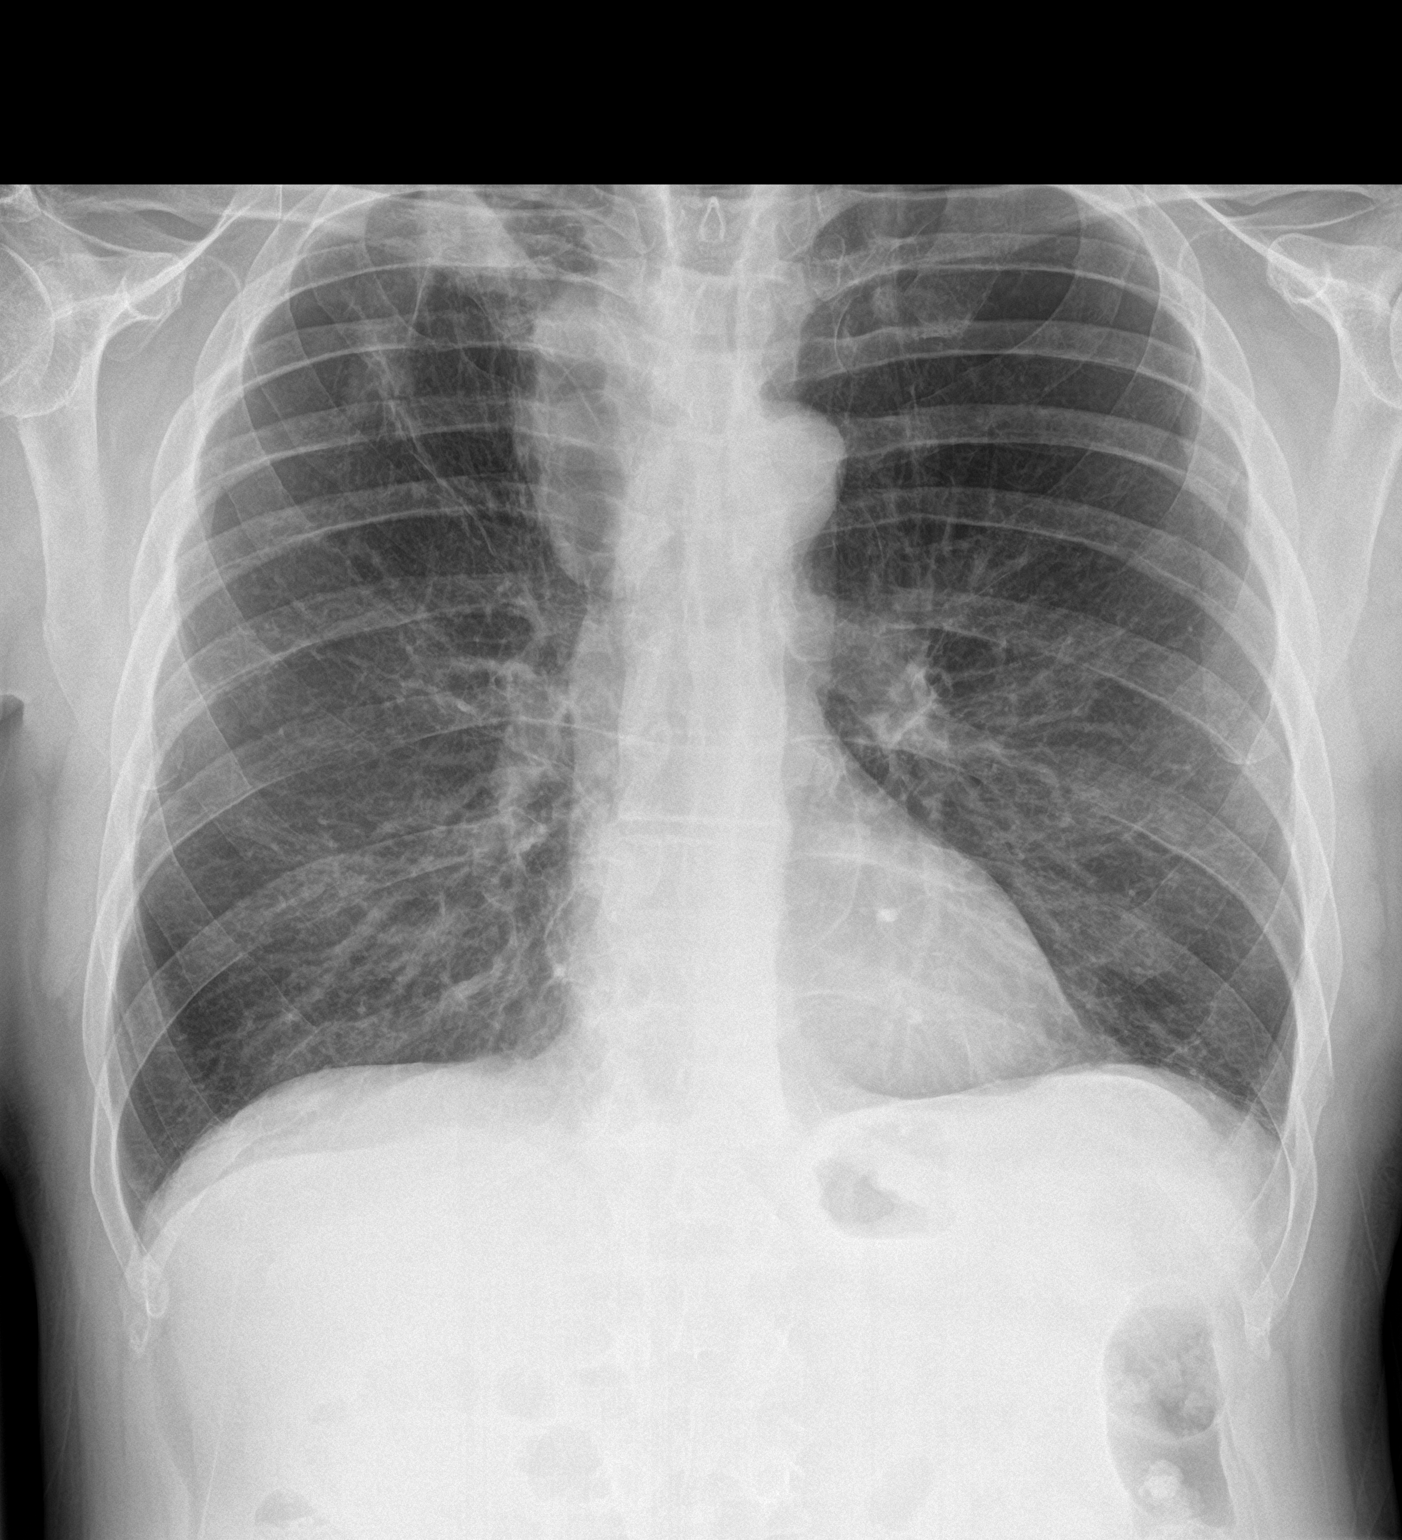
[im 2/2]
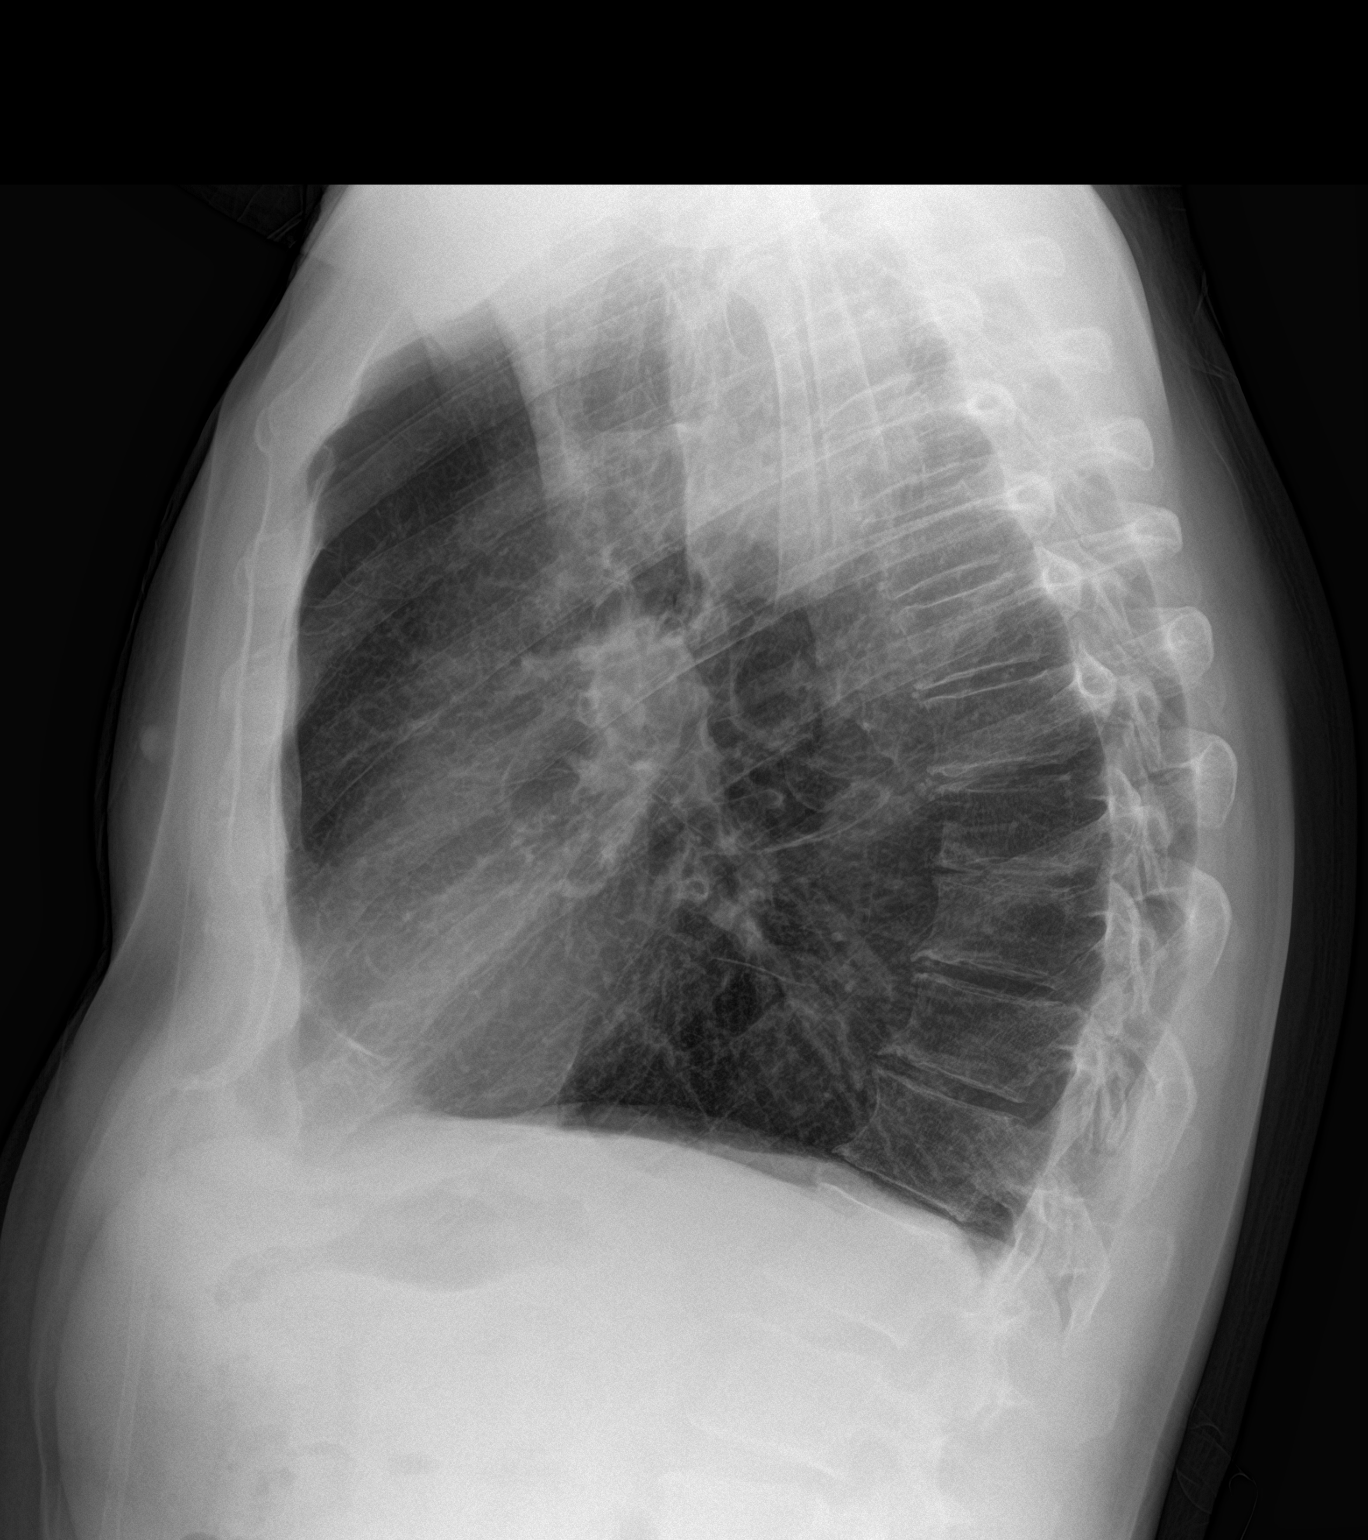

[2 of 2 positions shown; findings below may reference images not displayed]

FINDINGS: There is interval development of irregular solid density in right
lung apex as well as right paratracheal prominence concerning for
adenopathy or metastatic disease. These findings are highly
concerning for pulmonary malignancy. Left lung is clear. No
pneumothorax or pleural effusion is noted. Bony thorax is
unremarkable.
IMPRESSION: Irregular right apical solid density is noted concerning for primary
lung malignancy. There is also noted right paratracheal adenopathy
concerning for metastatic disease. CT scan of the chest with
intravenous contrast is recommended for further evaluation. These
results will be called to the ordering clinician or representative
by the Radiologist Assistant, and communication documented in the
PACS or zVision Dashboard.

## 2020-07-30 IMAGING — CT CT CHEST WITH CONTRAST
2 of 4 series · 15 of 36 positions shown, 18 images · non-contrast
Comparison: Chest x-ray 10/27/2018

CLINICAL DATA: 58-year-old male with an abnormality identified on
recent prior chest x-ray. Evaluate for underlying pulmonary
nodule/carcinoma.

EXAM:
CT CHEST WITHOUT CONTRAST
TECHNIQUE: Multidetector CT imaging of the chest was performed following the
standard protocol without IV contrast.

[Series 2: axial chest · axial · 0.72mm/px · z∈[-1388,-1084]mm · 12 of 180 slices shown, 15 images]
[im 14/180  mediastinal]
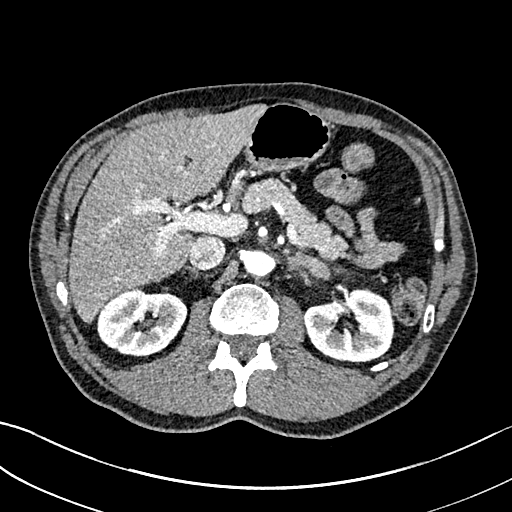
[im 14/180  lung]
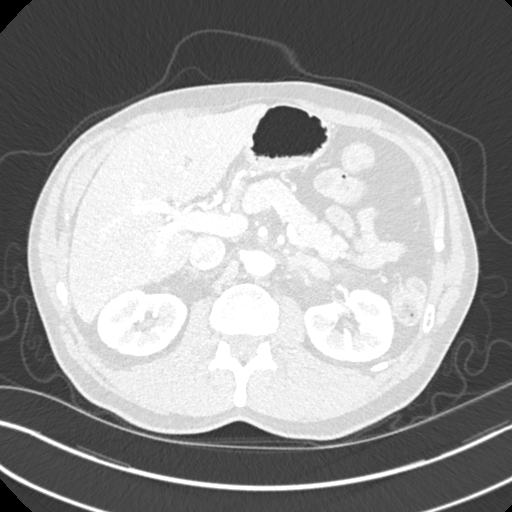
[im 28/180  lung]
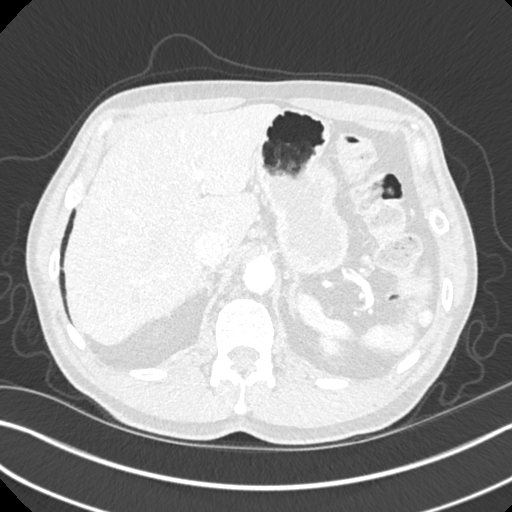
[im 42/180  lung]
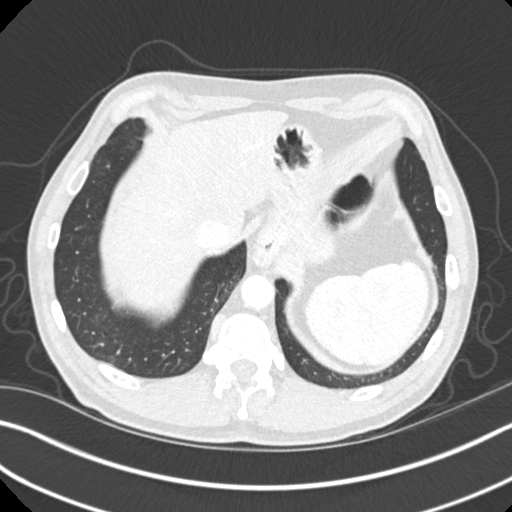
[im 56/180  lung]
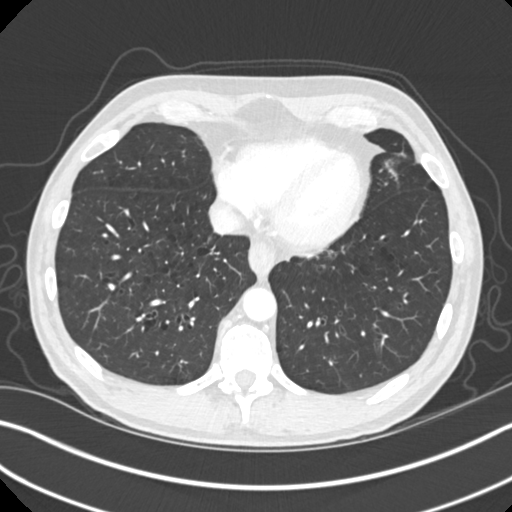
[im 69/180  mediastinal]
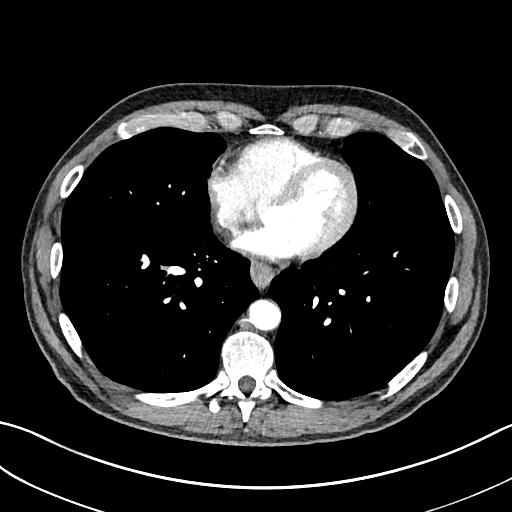
[im 69/180  lung]
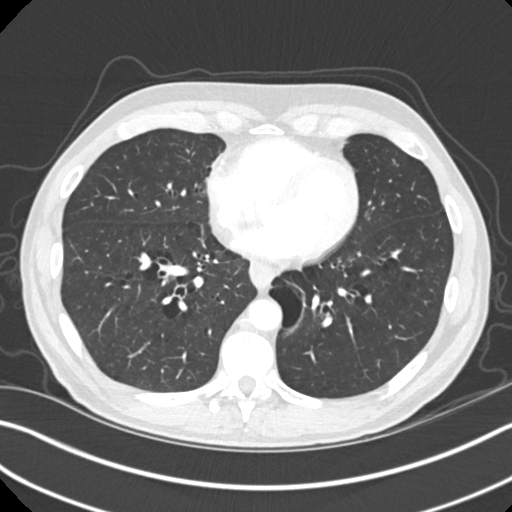
[im 83/180  lung]
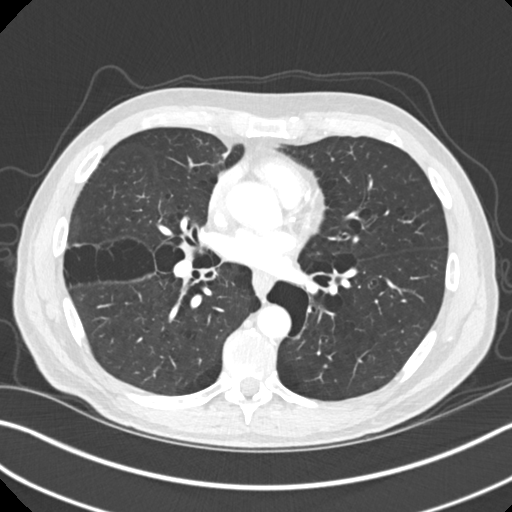
[im 97/180  lung]
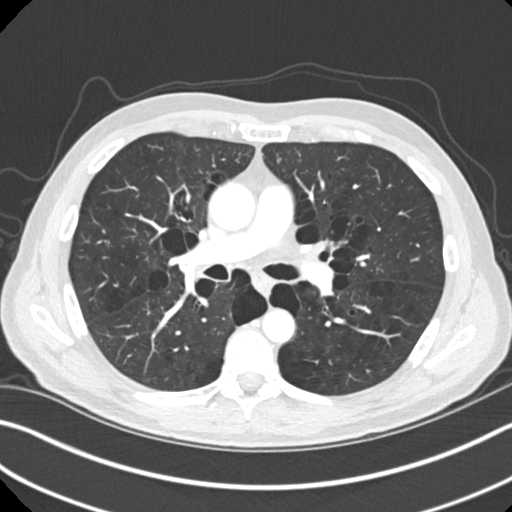
[im 111/180  lung]
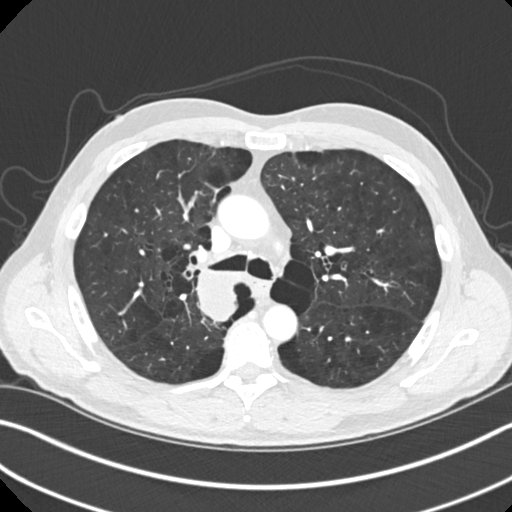
[im 124/180  mediastinal]
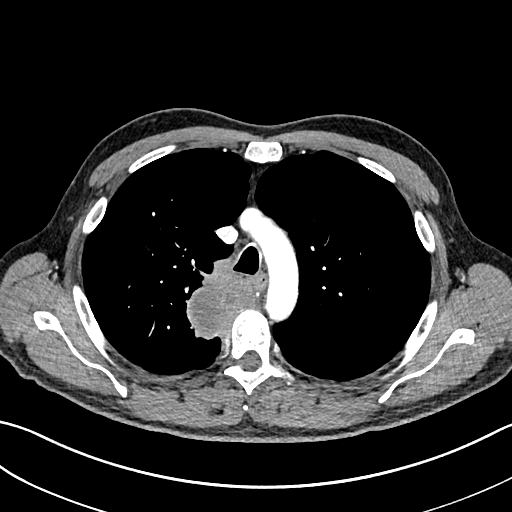
[im 124/180  lung]
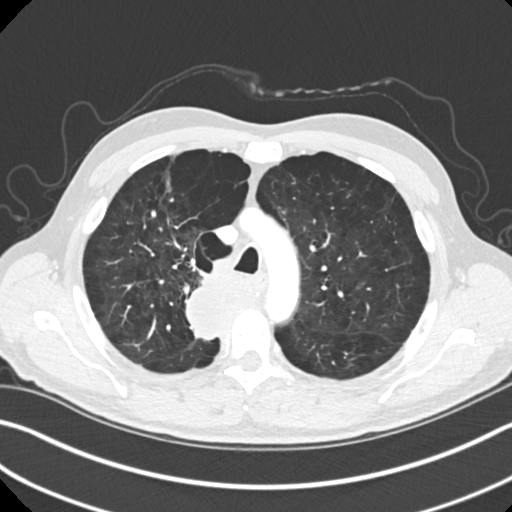
[im 138/180  lung]
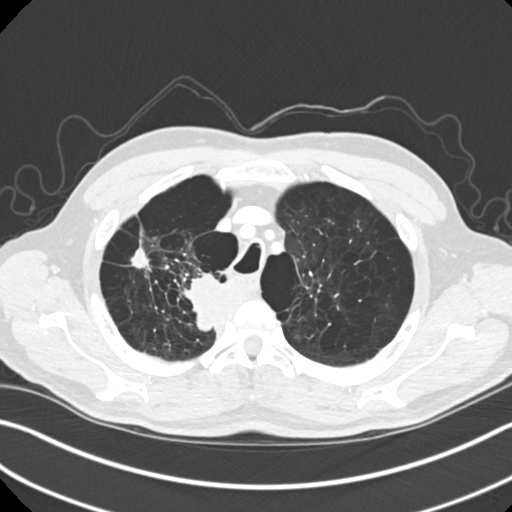
[im 152/180  lung]
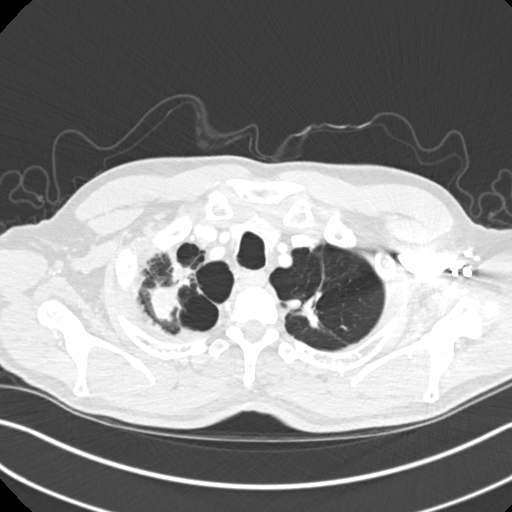
[im 166/180  lung]
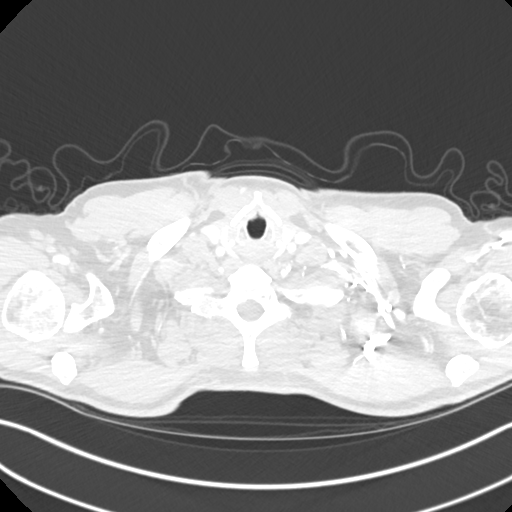

[Series 4: coronal chest · coronal · 0.70mm/px · 3 of 164 slices shown]
[im 33/164  lung]
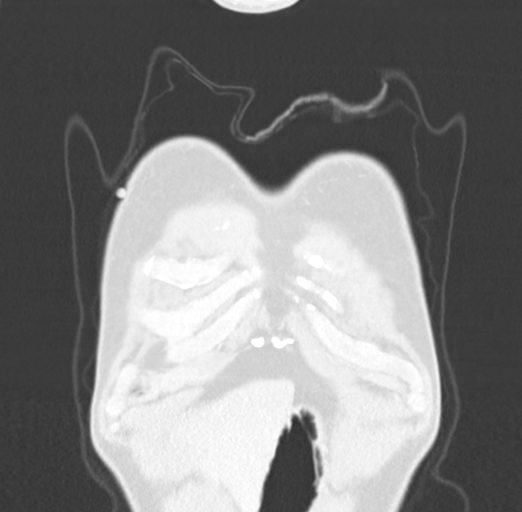
[im 66/164  lung]
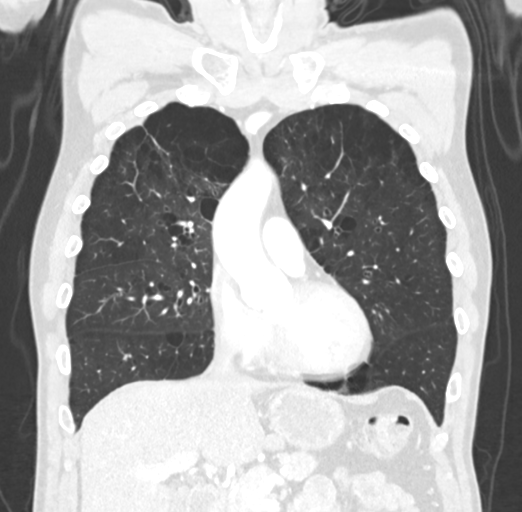
[im 98/164  lung]
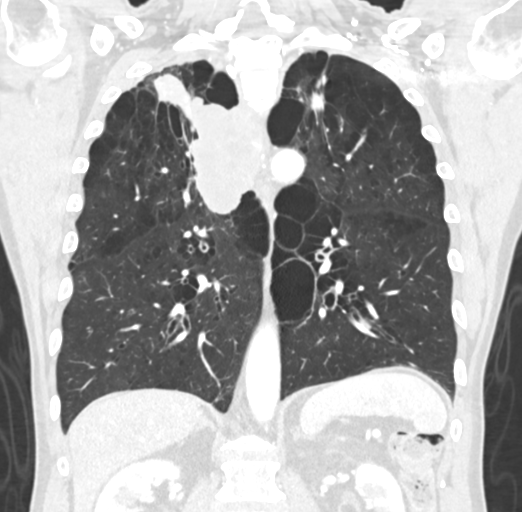

[15 of 36 positions shown; findings below may reference images not displayed]

FINDINGS: Cardiovascular: Conventional 3 vessel arch anatomy. The main
pulmonary artery is normal in size. No evidence of central PE. The
heart is normal in size. No pericardial effusion. Calcifications are
visualized along the coronary arteries.

Mediastinum/Nodes: Small hiatal hernia. Right upper lobe pulmonary
mass invading the posterior and middle mediastinum. Please see below
for further description. Rounded right paratracheal lymph node
measures 8.4 mm in short axis. While not definitively enlarged, this
is concerning for nodal metastatic disease. An additional possible
right paratracheal node measures 1.2 cm. This may actually represent
a lobulation of the primary mass.

Lungs/Pleura: Right upper lobe mass is lobulated and soft tissue
density. Maximal measurements are approximately 8.0 x 5.4 x 4.4 cm
(volume = 100 cm^3). The epicenter of the mass was likely within
the right upper lobe. Postobstructive changes are present in the
right lung apex. A satellite nodule is present more peripherally in
the right upper lobe measuring 1.5 x 1.4 cm. Biapical
pleuroparenchymal scarring. Moderately severe combined centrilobular
and paraseptal pulmonary emphysema.

Upper Abdomen: Irregular 3.6 x 1.9 cm left adrenal mass highly
concerning for metastatic disease. Tiny 4 mm hypoattenuating focus
in the superior aspect of hepatic segment 2 is too small to
characterize but likely benign.

Musculoskeletal: No acute fracture or aggressive appearing lytic or
blastic osseous lesion. Incompletely imaged cervical spinal
stabilization hardware.
IMPRESSION: 1. CT findings are concerning for metastatic primary bronchogenic
carcinoma. The epicenter of the primary 8.0 x 5.4 x 4.4 cm mass is
likely within the medial aspect of the right upper lobe. The mass
directly invades the adjacent posterior and middle mediastinum.
Postobstructive changes extend toward the right lung apex. There is
a satellite metastatic implant more peripherally in the right upper
lobe as well as irregular enlargement of the left adrenal gland
concerning for adrenal metastasis. Recommend PET-CT for further
evaluation. For tissue diagnosis, endobronchial biopsy could be
considered.
2. Moderately advanced combined centrilobular and paraseptal
pulmonary emphysema.
3. Coronary artery calcifications.
4. Small hiatal hernia.

Emphysema ([id number]).

These results will be called to the ordering clinician or
representative by the Radiologist Assistant, and communication
documented in the PACS or zVision Dashboard.

## 2023-05-31 ENCOUNTER — Ambulatory Visit (INDEPENDENT_AMBULATORY_CARE_PROVIDER_SITE_OTHER): Payer: 59 | Admitting: Family Medicine

## 2023-05-31 ENCOUNTER — Encounter: Payer: Self-pay | Admitting: Family Medicine

## 2023-05-31 VITALS — BP 134/68 | HR 74 | Temp 98.2°F | Resp 16 | Ht 68.0 in | Wt 142.0 lb

## 2023-05-31 DIAGNOSIS — C3490 Malignant neoplasm of unspecified part of unspecified bronchus or lung: Secondary | ICD-10-CM

## 2023-05-31 DIAGNOSIS — J42 Unspecified chronic bronchitis: Secondary | ICD-10-CM | POA: Diagnosis not present

## 2023-05-31 DIAGNOSIS — J40 Bronchitis, not specified as acute or chronic: Secondary | ICD-10-CM

## 2023-05-31 MED ORDER — AIRSUPRA 90-80 MCG/ACT IN AERO: 1.0000 | INHALATION_SPRAY | RESPIRATORY_TRACT | Status: DC | PRN

## 2023-05-31 NOTE — Progress Notes (Signed)
SUBJECTIVE:   Chief Complaint  Patient presents with   Establish Care   HPI Presents to clinic to establish care  Discussed the use of AI scribe software for clinical note transcription with the patient, who gave verbal consent to proceed.  History of Present Illness The patient, a 62 year old with a history of stage 4 single non-cell lung cancer, presents for primary care establishment and acute symptoms. The patient reports a recent onset of symptoms, including congestion, headache, chest pain, and productive cough, which began on Sunday. The patient describes these symptoms as consistent with his recurrent episodes of bronchitis, which he has experienced yearly since his teenage years. The patient also reports a slight fever with temperatures less than 100 degrees Fahrenheit.  The patient's lung cancer is currently managed by an oncologist at [facility], with regular CT scans every six months. The most recent scan was in September, and the patient reports no changes in his cancer status. The patient also has a history of a brain tumor, which was treated with radiation. A subsequent brain MRI revealed only scar tissue, with no recurrence of the tumor.  The patient's current medications include albuterol and Advair inhalers for lung symptoms, which he reports needing refills for. 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  The patient has a history of high anxiety, particularly around blood draws and needle sticks. He reports receiving annual flu and pneumococcal vaccines, with the most recent ones administered this year. The patient also reports a history of chronic bronchitis, which typically responds to antibiotic treatment.  The patient's previous primary care was short-lived due to the clinic's closure. The patient is seeking a new primary care provider primarily for prescription refills and management of his  recurrent bronchitis episodes.    PERTINENT PMH / PSH: As above  OBJECTIVE:  BP 134/68   Pulse 74   Temp 98.2 F (36.8 C)   Resp 16   Ht 5\' 8"  (1.727 m)   Wt 142 lb (64.4 kg)   SpO2 96%   BMI 21.59 kg/m    Physical Exam Vitals reviewed.  HENT:     Head: Normocephalic.     Right Ear: Tympanic membrane, ear canal and external ear normal.     Left Ear: Tympanic membrane, ear canal and external ear normal.     Nose: Nose normal.     Mouth/Throat:     Mouth: Mucous membranes are moist.  Eyes:     Conjunctiva/sclera: Conjunctivae normal.     Pupils: Pupils are equal, round, and reactive to light.  Neck:     Vascular: No carotid bruit.  Cardiovascular:     Rate and Rhythm: Normal rate and regular rhythm.     Pulses: Normal pulses.     Heart sounds: Normal heart sounds.  Pulmonary:     Effort: No respiratory distress.     Breath sounds: Rhonchi present.  Musculoskeletal:        General: Normal range of motion.     Cervical back: Normal range of motion and neck supple.     Right lower leg: No edema.     Left lower leg: No edema.  Lymphadenopathy:     Cervical: No cervical adenopathy.  Neurological:     Mental Status: He is alert.  Psychiatric:        Mood and Affect: Mood normal.        Behavior: Behavior normal.  Thought Content: Thought content normal.        Judgment: Judgment normal.        05/31/2023    2:01 PM  Depression screen PHQ 2/9  Decreased Interest 0  Down, Depressed, Hopeless 0  PHQ - 2 Score 0  Altered sleeping 0  Tired, decreased energy 1  Change in appetite 0  Feeling bad or failure about yourself  0  Trouble concentrating 0  Moving slowly or fidgety/restless 0  Suicidal thoughts 0  PHQ-9 Score 1  Difficult doing work/chores Not difficult at all      05/31/2023    2:01 PM  GAD 7 : Generalized Anxiety Score  Nervous, Anxious, on Edge 0  Control/stop worrying 0  Worry too much - different things 0  Trouble relaxing 0   Restless 0  Easily annoyed or irritable 0  Afraid - awful might happen 0  Total GAD 7 Score 0  Anxiety Difficulty Not difficult at all    ASSESSMENT/PLAN:  Bronchitis Assessment & Plan: Onset of symptoms on Sunday with congestion, headache, drainage, chest pain, and slight fever. History of chronic bronchitis and lung cancer. Patient requests antibiotics based on past experience. -Prescribed Amoxicillin 875mg  by hospital provider -Advise patient to contact office if symptoms worsen or do not improve.   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 Regular CT scans with contrast are performed. Last scan showed no change in size or position of the cancerous spot. -No changes to current management plan.   Chronic bronchitis, unspecified chronic bronchitis type [facility]) Assessment & Plan: Patient uses Albuterol and Advair for management. Works in a high dust area. -Recommend trial of Airsupra as it may decrease exacerbations. -Discontinue Albuterol if Paulene Floor is effective.    General Health Maintenance -Ensure patient has received annual flu shot and pneumococcal vaccine. -Declined all primary health care screening   PDMP reviewed  Return if symptoms worsen or fail to improve, for PCP.  Dana Allan, MD

## 2023-05-31 NOTE — Patient Instructions (Signed)
It was a pleasure meeting you today. Thank you for allowing me to take part in your health care.  Our goals for today as we discussed include:  Stop Albuterol for now Start Airsupra 1-2 puffs every 4 hours as needed for rescue therapy Continue Advair as directed Please call to send in correct dose of your Advair  Notify MD in 2-3 days if Airsupra not effective and will reorder Albuterol.  Will review previous medical records  This is a list of the screening recommended for you and due dates:  Health Maintenance  Topic Date Due   HIV Screening  Never done   Hepatitis C Screening  Never done   DTaP/Tdap/Td vaccine (1 - Tdap) Never done   Zoster (Shingles) Vaccine (1 of 2) Never done   Colon Cancer Screening  Never done   Screening for Lung Cancer  11/07/2019   COVID-19 Vaccine (3 - Pfizer risk series) 12/31/2019   Flu Shot  02/17/2023   HPV Vaccine  Aged Out    Follow up as needed  If you have any questions or concerns, please do not hesitate to call the office at [phone].  I look forward to our next visit and until then take care and stay safe.  Regards,   Dana Allan, MD   [facility]

## 2023-06-03 ENCOUNTER — Telehealth: Payer: Self-pay | Admitting: Family Medicine

## 2023-06-03 ENCOUNTER — Other Ambulatory Visit: Payer: Self-pay | Admitting: Family Medicine

## 2023-06-03 DIAGNOSIS — J40 Bronchitis, not specified as acute or chronic: Secondary | ICD-10-CM

## 2023-06-03 MED ORDER — AIRSUPRA 90-80 MCG/ACT IN AERO: 1.0000 | INHALATION_SPRAY | RESPIRATORY_TRACT | 6 refills | Status: DC | PRN

## 2023-06-03 NOTE — Telephone Encounter (Signed)
Called pt and made him aware.

## 2023-06-03 NOTE — Telephone Encounter (Signed)
Patient came into office today to let Dr Clent Ridges. Dr Clent Ridges gave him a inhaler to try. He likes that rescue inhaler that she gave him to try and to please go ahead and  order for him.

## 2023-06-06 ENCOUNTER — Telehealth: Payer: Self-pay

## 2023-06-06 ENCOUNTER — Encounter: Payer: Self-pay | Admitting: Family Medicine

## 2023-06-06 ENCOUNTER — Other Ambulatory Visit: Payer: Self-pay | Admitting: Family Medicine

## 2023-06-06 DIAGNOSIS — J40 Bronchitis, not specified as acute or chronic: Secondary | ICD-10-CM | POA: Insufficient documentation

## 2023-06-06 DIAGNOSIS — C3490 Malignant neoplasm of unspecified part of unspecified bronchus or lung: Secondary | ICD-10-CM | POA: Insufficient documentation

## 2023-06-06 DIAGNOSIS — J42 Unspecified chronic bronchitis: Secondary | ICD-10-CM

## 2023-06-06 DIAGNOSIS — J449 Chronic obstructive pulmonary disease, unspecified: Secondary | ICD-10-CM | POA: Insufficient documentation

## 2023-06-06 MED ORDER — AMOXICILLIN 875 MG PO TABS: 875.0000 mg | ORAL_TABLET | Freq: Two times a day (BID) | ORAL | 0 refills | Status: DC

## 2023-06-06 NOTE — Assessment & Plan Note (Signed)
Patient uses Albuterol and Advair for management. Works in a high dust area. -Recommend trial of Airsupra as it may decrease exacerbations. -Discontinue Albuterol if Paulene Floor is effective.

## 2023-06-06 NOTE — Telephone Encounter (Signed)
ERROR

## 2023-06-06 NOTE — Assessment & Plan Note (Signed)
Onset of symptoms on Sunday with congestion, headache, drainage, chest pain, and slight fever. History of chronic bronchitis and lung cancer. Patient requests antibiotics based on past experience. -Prescribed Amoxicillin 875mg  by hospital provider -Advise patient to contact office if symptoms worsen or do not improve.

## 2023-06-06 NOTE — Telephone Encounter (Signed)
Patient states he has a really bad sinus infection and he believes he needs an antibiotic.  Patient states his oncologist sent a prescription in to [facility] for cefuroxine.  Patient states it worked enough for him to sleep at night.  Patient states he is still coughing up nastiness.  Patient states he needs amoxicillin 875 MG, as this has worked for him in the past.  Patient states he spoke with Dr. Dana Allan about this when he saw her on 05/31/2023.  Patient states he will end up in the hospital if we don't get this fixed and he does not want to go this route.  Patient states he would like for Dr. Clent Ridges to please call him.

## 2023-06-06 NOTE — Telephone Encounter (Signed)
Patient walked into office to see if Dr Clent Ridges had read his phone message from this morning. Patient was advised that the message was sent to her, but she is seeing patient's, and has not read the note. Front office offered to schedule an appointment for him. Patient put up his hands up in the air, turned around and walked out of the office.

## 2023-06-06 NOTE — Assessment & Plan Note (Signed)
Patient is under the care of an oncologist at [facility]. Regular CT scans with contrast are performed. Last scan showed no change in size or position of the cancerous spot. -No changes to current management plan.

## 2023-06-07 NOTE — Telephone Encounter (Signed)
Called pt and he stated that he wanted provider to know that he took 1 amoxil yesterday and 1 this morning and is feeling better already.

## 2023-06-08 ENCOUNTER — Emergency Department
Admission: EM | Admit: 2023-06-08 | Discharge: 2023-06-08 | Disposition: A | Discharge status: Home / Self Care | Payer: 59 | Attending: Emergency Medicine | Admitting: Emergency Medicine

## 2023-06-08 ENCOUNTER — Emergency Department: Payer: 59

## 2023-06-08 ENCOUNTER — Other Ambulatory Visit: Payer: Self-pay

## 2023-06-08 DIAGNOSIS — R051 Acute cough: Secondary | ICD-10-CM

## 2023-06-08 DIAGNOSIS — Z85118 Personal history of other malignant neoplasm of bronchus and lung: Secondary | ICD-10-CM | POA: Diagnosis not present

## 2023-06-08 DIAGNOSIS — R0602 Shortness of breath: Secondary | ICD-10-CM | POA: Diagnosis present

## 2023-06-08 DIAGNOSIS — J441 Chronic obstructive pulmonary disease with (acute) exacerbation: Secondary | ICD-10-CM | POA: Insufficient documentation

## 2023-06-08 MED ORDER — PREDNISONE 20 MG PO TABS: 40.0000 mg | ORAL_TABLET | Freq: Every day | ORAL | 0 refills | Status: AC

## 2023-06-08 MED ORDER — AMOXICILLIN-POT CLAVULANATE 875-125 MG PO TABS: 1.0000 | ORAL_TABLET | Freq: Two times a day (BID) | ORAL | 0 refills | Status: AC

## 2023-06-08 NOTE — ED Notes (Addendum)
This RN to bedside to assess pt. Pt. Has fast, pressured speech, is using profanity when explaining condition. This RN discusses pt's condition with him, empathizing with pt's frustration over care he has received previously, and attempting to reorient pt. To today's condition and goals. This RN asks pt's permission to draw his blood, pt. States "absolutely not." This RN assures pt. He has a right to refuse. This RN assures pt. That staff is here to help him, and this RN will do what she can to help. Pt. Is sarcastic and rude when responding to this RN's offer to help. This RN asked pt. If he would be willing to get on the bed to have VS taken, this RN explains she would like to watch his oxygen level since he is getting winded so easily. Pt states, "no!" But gets up and sits down on stretcher. This RN tells pt. Again she is only trying to help, pt states, "well I don't want your help!" Pt. Repeats this twice and then asks RN to listen to his cough. This RN notified MD and security of interaction.

## 2023-06-08 NOTE — Discharge Instructions (Signed)
You were seen in the emergency department today for evaluation of your cough and congestion.  I suspect you likely have a COPD flare.  Your x-Valbona Slabach fortunately was without evidence of pneumonia.  Please discontinue taking the amoxicillin that you have been taking and instead start the prescription for Augmentin that I have sent.  Please also start taking the prednisone prescription I have sent.  You can continue to use your home inhalers.  Follow with your primary care doctor for further evaluation.  Return to the ER for new or worsening symptoms.

## 2023-06-08 NOTE — ED Triage Notes (Signed)
Pt to ED for shob since being a teenager, reports chronic bronchitis. States has been trying to get antibiotics for a week. RR even and unlabored, speaking in complete sentences.  Pt reports also has stage 4 cancer.  Pt refusing blood work, states causes too much anxiety. Requests chest xray

## 2023-06-08 NOTE — ED Notes (Signed)
Pt. Removed pulse ox and blood pressure monitoring.

## 2023-06-08 NOTE — ED Notes (Signed)
Pt REFUSED blood work. He said that it makes him really anxious and that he knows what is wrong with him and that he just wants xrays to confirm it. This tech said that he had the right to refuse and that is what he is doing.

## 2023-06-08 NOTE — Telephone Encounter (Signed)
Pt called and stated that he would like a chest xray asap because he had to leave work because he could not breathe. Pt stated this is an ongoing issue

## 2023-06-08 NOTE — ED Notes (Signed)
Per Duwayne Heck EDT, pt refused blood work in triage

## 2023-06-08 NOTE — ED Provider Notes (Signed)
[facility] Provider Note    Event Date/Time   First MD Initiated Contact with Patient 06/08/23 1644     (approximate)   History   Shortness of Breath   HPI  Ian Taylor is a 62 year old male with history of metastatic lung cancer, COPD presenting to the emergency department for evaluation of cough and congestion.  Patient reports that around this time of year he often develops congestion with productive cough.  He was in between primary care doctors, so he had symptoms for about a week and a half before he was started on antibiotics a few days ago.  He has been taking amoxicillin for the past 3 days, but continues to have symptoms, though he does feel they are slightly improved.  Has been using his inhalers with some improvement.  He thinks he might need something stronger as he only has 2 days left of his antibiotic.     Physical Exam   Triage Vital Signs: ED Triage Vitals  Encounter Vitals Group     BP 06/08/23 1409 (!) 147/85     Systolic BP Percentile --      Diastolic BP Percentile --      Pulse Rate 06/08/23 1409 62     Resp 06/08/23 1409 20     Temp 06/08/23 1409 97.9 F (36.6 C)     Temp Source 06/08/23 1409 Oral     SpO2 06/08/23 1409 97 %     Weight 06/08/23 1410 144 lb (65.3 kg)     Height 06/08/23 1410 5\' 8"  (1.727 m)     Head Circumference --      Peak Flow --      Pain Score 06/08/23 1417 0     Pain Loc --      Pain Education --      Exclude from Growth Chart --     Most recent vital signs: Vitals:   06/08/23 1409  BP: (!) 147/85  Pulse: 62  Resp: 20  Temp: 97.9 F (36.6 C)  SpO2: 97%     General: Awake, interactive  CV:  Regular rate, good peripheral perfusion.  Resp:  Mildly diminished lung sounds but fair air movement without significant appreciable wheezing Abd:  Nondistended Neuro:  Symmetric facial movement, fluid speech   ED Results / Procedures / Treatments   Labs (all labs ordered are listed, but  only abnormal results are displayed) Labs Reviewed - No data to display    EKG EKG independently reviewed interpreted by myself (ER attending) demonstrates:  EKG demonstrates normal sinus rhythm at a rate of 64, PR 198, QRS 80, QTc 396, no acute ST changes  RADIOLOGY Imaging independently reviewed and interpreted by myself demonstrates:  CXR demonstrates findings consistent with COPD, no focal consolidation or pneumothorax noted  PROCEDURES:  Critical Care performed: No  Procedures   MEDICATIONS ORDERED IN ED: Medications - No data to display   IMPRESSION / MDM / ASSESSMENT AND PLAN / ED COURSE  I reviewed the triage vital signs and the nursing notes.  Differential diagnosis includes, but is not limited to, COPD exacerbation, viral illness, pneumonia, consideration for PE, ACS though lower suspicion based on clinical history  Patient's presentation is most consistent with acute illness / injury with system symptoms.  62 year old male presenting to the emergency department for evaluation of cough and congestion.  Vital stable on presentation.  X-Murriel Taylor without focal consolidation.  Presentation seems most consistent with COPD exacerbation with possible viral  illness triggering flare.  Has been taking a few days of antibiotics with limited benefit.  Has not been on steroids.  Has been using bronchodilators with improvement.  Significantly easier here, offered bronchodilators, patient declines.  Repeatedly declining lab work or further evaluation.  He does demonstrate decision-making capacity.  With shared decision making, will switch patient's antibiotics to Augmentin for better coverage given his sputum change and start oral steroids.  He will continue to use his home bronchodilators.  Strict return precautions provided.  Patient discharged stable condition.     FINAL CLINICAL IMPRESSION(S) / ED DIAGNOSES   Final diagnoses:  COPD exacerbation (HCC)  Acute cough     Rx / DC  Orders   ED Discharge Orders          Ordered    predniSONE (DELTASONE) 20 MG tablet  Daily with breakfast        06/08/23 1723    amoxicillin-clavulanate (AUGMENTIN) 875-125 MG tablet  2 times daily        06/08/23 1723             Note:  This document was prepared using Dragon voice recognition software and may include unintentional dictation errors.   Trinna Post, MD 06/08/23 [phone]

## 2023-06-20 ENCOUNTER — Telehealth: Payer: Self-pay | Admitting: Family Medicine

## 2023-06-20 NOTE — Telephone Encounter (Signed)
Left message to return call to our office.  Pt needs appointment to get medication refilled.

## 2023-06-20 NOTE — Telephone Encounter (Signed)
Prescription Request  06/20/2023  LOV: 05/31/2023  What is the name of the medication or equipment? Augmentin and prednisone  Have you contacted your pharmacy to request a refill? No   Which pharmacy would you like this sent to?  [facility] [address], [place] - [address] [place] [address] Phone: [phone] Fax: [phone]    Patient notified that their request is being sent to the clinical staff for review and that they should receive a response within 2 business days.   Please advise at Mobile [phone] (mobile)

## 2023-06-21 NOTE — Telephone Encounter (Signed)
Would like to speak with you before calling patient .

## 2023-06-21 NOTE — Telephone Encounter (Signed)
Pt called office asking for update on med refill request for Augmentin & prednisone? Told pt Kelly's response regarding pt needing an appt in order to receive rx for meds. Pt states he was tired of Dr. Claris Che a--. Pt stated Dr. Clent Ridges was lazy & cannot get off her a** to refill his meds. Pt states he will be reporting Dr. Clent Ridges to higher authority because he needs an appt for meds. Pt continuous to say f---- our staff & Dr. Clent Ridges, this situation is bulls----. Please advise. Call back # [phone].

## 2023-06-22 ENCOUNTER — Telehealth: Payer: Self-pay | Admitting: Family Medicine

## 2023-06-22 ENCOUNTER — Encounter: Payer: Self-pay | Admitting: Family Medicine

## 2023-06-22 NOTE — Telephone Encounter (Signed)
error 

## 2023-06-22 NOTE — Telephone Encounter (Signed)
Spoke with Dr. Clent Ridges she advised she had discussed with patient she would not refill medications such as antibiotics without evaluation at initial visit to establish and more recently in a phone conversation. I have left message for patient today asking for return call to office to here patient. Provider wants patient dismissed  due to patient non-compliant and demands of treatment without evaluation.

## 2023-06-24 ENCOUNTER — Telehealth: Payer: Self-pay | Admitting: Emergency Medicine

## 2023-06-24 NOTE — Telephone Encounter (Signed)
Patient has been dismissed from [employer] at [facility] and all [facility] practices/providers due to displaying a threatening,hostile attitude towards staff.

## 2024-02-11 ENCOUNTER — Emergency Department

## 2024-02-11 ENCOUNTER — Other Ambulatory Visit: Payer: Self-pay

## 2024-02-11 ENCOUNTER — Emergency Department: Admission: EM | Admit: 2024-02-11 | Discharge: 2024-02-11 | Disposition: A | Discharge status: Home / Self Care

## 2024-02-11 DIAGNOSIS — J441 Chronic obstructive pulmonary disease with (acute) exacerbation: Secondary | ICD-10-CM | POA: Insufficient documentation

## 2024-02-11 DIAGNOSIS — R052 Subacute cough: Secondary | ICD-10-CM

## 2024-02-11 DIAGNOSIS — R059 Cough, unspecified: Secondary | ICD-10-CM | POA: Diagnosis present

## 2024-02-11 DIAGNOSIS — Z85118 Personal history of other malignant neoplasm of bronchus and lung: Secondary | ICD-10-CM | POA: Diagnosis not present

## 2024-02-11 DIAGNOSIS — R519 Headache, unspecified: Secondary | ICD-10-CM

## 2024-02-11 DIAGNOSIS — Z85841 Personal history of malignant neoplasm of brain: Secondary | ICD-10-CM | POA: Diagnosis not present

## 2024-02-11 LAB — SARS CORONAVIRUS 2 BY RT PCR: SARS Coronavirus 2 by RT PCR: NEGATIVE

## 2024-02-11 LAB — BASIC METABOLIC PANEL WITH GFR
Anion gap: 9 (ref 5–15)
BUN: 12 mg/dL (ref 8–23)
CO2: 25 mmol/L (ref 22–32)
Calcium: 9 mg/dL (ref 8.9–10.3)
Chloride: 103 mmol/L (ref 98–111)
Creatinine, Ser: 1.08 mg/dL (ref 0.61–1.24)
GFR, Estimated: >60 mL/min (ref >60)
Glucose, Bld: 96 mg/dL (ref 70–99)
Potassium: 3.7 mmol/L (ref 3.5–5.1)
Sodium: 137 mmol/L (ref 135–145)

## 2024-02-11 LAB — CBC
HCT: 38.1 % — ABNORMAL LOW (ref 39.0–52.0)
Hemoglobin: 12.7 g/dL — ABNORMAL LOW (ref 13.0–17.0)
MCH: 31.8 pg (ref 26.0–34.0)
MCHC: 33.3 g/dL (ref 30.0–36.0)
MCV: 95.5 fL (ref 80.0–100.0)
Platelets: 282 K/uL (ref 150–400)
RBC: 3.99 MIL/uL — ABNORMAL LOW (ref 4.22–5.81)
RDW: 12.2 % (ref 11.5–15.5)
WBC: 11.3 K/uL — ABNORMAL HIGH (ref 4.0–10.5)
nRBC: 0 % (ref 0.0–0.2)

## 2024-02-11 MED ORDER — BENZONATATE 100 MG PO CAPS: 100.0000 mg | ORAL_CAPSULE | Freq: Three times a day (TID) | ORAL | 0 refills | Status: DC | PRN

## 2024-02-11 MED ORDER — PREDNISONE 20 MG PO TABS: 40.0000 mg | ORAL_TABLET | Freq: Every day | ORAL | 0 refills | Status: AC

## 2024-02-11 NOTE — ED Provider Notes (Signed)
 [facility] Provider Note    Event Date/Time   First MD Initiated Contact with Patient 02/11/24 1707     (approximate)   History   Shortness of Breath  Pt to ED for c/o shortness of breath, cough, headache that has been going on for about a week. Pt states he is feeling a little disoriented and has cotton mouth. Pt states sweats on Monday and Tuesday. Pt denies fever. A&O, speaking in full sentences, NAD.   HPI Ian Taylor is a 63 y.o. 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  No fevers.  No hemoptysis.  Also notes dry mouth as well as severe headaches.  No preceding trauma.  Not on blood thinners. -Does have outpatient CT chest abdomen pelvis as well as MRI brain scheduled for this Tuesday per patient report for surveillance monitoring.  Has had prior tumor resections from his brain and wonders if he may have had recurrence of metastases to brain.  Has already completed a 2-year course of chemotherapy, no longer actively on chemo.     Physical Exam   Triage Vital Signs: ED Triage Vitals  Encounter Vitals Group     BP 02/11/24 1630 122/88     Girls Systolic BP Percentile --      Girls Diastolic BP Percentile --      Boys Systolic BP Percentile --      Boys Diastolic BP Percentile --      Pulse Rate 02/11/24 1630 92     Resp 02/11/24 1630 18     Temp 02/11/24 1630 98.3 F (36.8 C)     Temp Source 02/11/24 1630 Oral     SpO2 02/11/24 1630 96 %     Weight 02/11/24 1635 138 lb (62.6 kg)     Height 02/11/24 1635 5' 8 (1.727 m)     Head Circumference --      Peak Flow --      Pain Score 02/11/24 1631 10     Pain Loc --      Pain Education --      Exclude from Growth Chart --     Most recent vital signs: Vitals:   02/11/24 1630 02/11/24 1824   BP: 122/88 131/77  Pulse: 92 80  Resp: 18 (!) 24  Temp: 98.3 F (36.8 C) 98.4 F (36.9 C)  SpO2: 96% 93%   General: Awake, no distress.  CV:  Good peripheral perfusion. RRR, RP 2+ Resp:  Normal effort. CTAB Abd:  No distention. Nontender to deep palpation throughout Neuro:  Aox4, CN II-XII intact, FNF wnl, finger taps fast b/l, 5/5 strength in bilateral finger extension/grip, arm flexion/extension, EHL/FHL. BUE AG 10+ sec no drift, BLE AG 5+ sec no drift. Ambulates with steady gait. SILT. Negative Rhomberg.    ED Results / Procedures / Treatments   Labs (all labs ordered are listed, but only abnormal results are displayed) Labs Reviewed  CBC - Abnormal; Notable for the following components:      Result Value   WBC 11.3 (*)    RBC 3.99 (*)    Hemoglobin 12.7 (*)    HCT 38.1 (*)    All other components within normal limits  SARS CORONAVIRUS 2 BY RT PCR  BASIC METABOLIC PANEL WITH GFR     EKG  See ED  course below.   RADIOLOGY Radiology interpreted myself and radiology report reviewed.  No acute pathology identified.    PROCEDURES:  Critical Care performed: No  Procedures   MEDICATIONS ORDERED IN ED: Medications - No data to display   IMPRESSION / MDM / ASSESSMENT AND PLAN / ED COURSE  I reviewed the triage vital signs and the nursing notes.                              DDX/MDM/AP: Differential diagnosis includes, but is not limited to, mild COPD exacerbation, pneumonia, viral syndrome, consider possibility of recurrent malignancy, consider but doubt pulmonary embolism.  With regard to headache, consider benign headache type versus possible recurrent metastases with or without hemorrhage.  Fortunately nonfocal neurologic exam here.  Plan: - Labs - Chest x-ray -EKG - Patient declines DuoNeb testing - Recommended expediting patient's outpatient imaging with CT head with contrast as well as CT chest abdomen pelvis with contrast given his multiple symptoms  and concern for possible recurrence of underlying malignancy-patient repeatedly declines this and prefers to pursue his outpatient testing and follow-up with his oncologist.  Patient's presentation is most consistent with acute presentation with potential threat to life or bodily function.  The patient is on the cardiac monitor to evaluate for evidence of arrhythmia and/or significant heart rate changes.  ED course below.  Laboratory workup notable only for mild leukocytosis.  Patient otherwise remains hemodynamically stable here in emergency department and declines any further testing.  Understands risks of missed pathology that could be dangerous and lead to poor outcomes including death.  Will proceed with plan for outpatient imaging per patient request which is scheduled to occur in about 2 and half days.  Overall well-appearing in discharge with stable vitals.  Rx Tessalon  Perles for cough, recommend Tylenol  for headache, also Rx short course of prednisone  for possible mild COPD exacerbation.  Has albuterol  inhaler at home, will continue with this.  ED return precautions in place.  Patient agrees with plan.  Clinical Course as of 02/11/24 1917  Sat Feb 11, 2024  1727 CBC with mild cytosis, mild anemia BMP reviewed, unremarkable COVID-negative [MM]  1728 CXR: IMPRESSION: 1. No active cardiopulmonary disease. 2. Emphysema.   [MM]  1728 Ecg = sinus rhythm, rate 85, no gross ST elevation or depression, no significant repolarization normality, normal axis, normal intervals.  No evidence of ischemia no arrhythmia on my interpretation. [MM]    Clinical Course User Index [MM] Clarine Ozell LABOR, MD     FINAL CLINICAL IMPRESSION(S) / ED DIAGNOSES   Final diagnoses:  COPD exacerbation (HCC)  Subacute cough  Nonintractable headache, unspecified chronicity pattern, unspecified headache type     Rx / DC Orders   ED Discharge Orders          Ordered    predniSONE  (DELTASONE ) 20 MG tablet   Daily with breakfast        02/11/24 1801    benzonatate  (TESSALON  PERLES) 100 MG capsule  3 times daily PRN        02/11/24 1801             Note:  This document was prepared using Dragon voice recognition software and may include unintentional dictation errors.   Clarine Ozell LABOR, MD 02/11/24 [phone]

## 2024-02-11 NOTE — ED Triage Notes (Signed)
 Pt to ED for c/o shortness of breath, cough, headache that has been going on for about a week. Pt states he is feeling a little disoriented and has cotton mouth. Pt states sweats on Monday and Tuesday. Pt denies fever. A&O, speaking in full sentences, NAD.

## 2024-02-11 NOTE — Discharge Instructions (Signed)
 Evaluation in the emergency department showed no obvious medical problems at this time.  You prefer to defer further imaging to your upcoming appointments this Tuesday.  I prescribed you a cough medication as well as a short course of steroids in case you are having a mild flare of your COPD.  You can use Tylenol  as needed for any recurrent headache.  Please do follow-up with your primary care provider and oncologist for reevaluation, and return to the emergency department with any new or worsening symptoms.

## 2024-07-23 ENCOUNTER — Emergency Department

## 2024-07-23 ENCOUNTER — Emergency Department
Admission: EM | Admit: 2024-07-23 | Discharge: 2024-07-23 | Disposition: A | Discharge status: Home / Self Care | Attending: Emergency Medicine | Admitting: Emergency Medicine

## 2024-07-23 ENCOUNTER — Other Ambulatory Visit: Payer: Self-pay

## 2024-07-23 DIAGNOSIS — Z5321 Procedure and treatment not carried out due to patient leaving prior to being seen by health care provider: Secondary | ICD-10-CM | POA: Diagnosis not present

## 2024-07-23 DIAGNOSIS — R059 Cough, unspecified: Secondary | ICD-10-CM | POA: Insufficient documentation

## 2024-07-23 DIAGNOSIS — R0602 Shortness of breath: Secondary | ICD-10-CM | POA: Diagnosis present

## 2024-07-23 DIAGNOSIS — C3490 Malignant neoplasm of unspecified part of unspecified bronchus or lung: Secondary | ICD-10-CM | POA: Diagnosis not present

## 2024-07-23 NOTE — ED Notes (Signed)
 Refusing blood in Triage due to 'needles make me very anxious'.

## 2024-07-23 NOTE — ED Triage Notes (Signed)
 C/O cough x 2-3 days an dworsening SOB x 1 week.  Has stage 4 lung CA, not currently receiving Chemo or radiation.  AAOx3. Skin warm and dry. No SOB/ DOe

## 2024-07-23 NOTE — ED Notes (Signed)
 Pt states he is leaving. Pt states he has been here 2 hour and he is starving and about to get nauseated. Pt ambulatory with steady gait out the ED lobby doors.

## 2024-07-29 ENCOUNTER — Other Ambulatory Visit: Payer: Self-pay

## 2024-07-29 ENCOUNTER — Emergency Department

## 2024-07-29 ENCOUNTER — Emergency Department
Admission: EM | Admit: 2024-07-29 | Discharge: 2024-07-29 | Disposition: A | Discharge status: Home / Self Care | Attending: Emergency Medicine | Admitting: Emergency Medicine

## 2024-07-29 DIAGNOSIS — J40 Bronchitis, not specified as acute or chronic: Secondary | ICD-10-CM

## 2024-07-29 DIAGNOSIS — J441 Chronic obstructive pulmonary disease with (acute) exacerbation: Secondary | ICD-10-CM | POA: Diagnosis not present

## 2024-07-29 DIAGNOSIS — Z85118 Personal history of other malignant neoplasm of bronchus and lung: Secondary | ICD-10-CM | POA: Insufficient documentation

## 2024-07-29 DIAGNOSIS — R0602 Shortness of breath: Secondary | ICD-10-CM

## 2024-07-29 DIAGNOSIS — R059 Cough, unspecified: Secondary | ICD-10-CM

## 2024-07-29 LAB — RESP PANEL BY RT-PCR (RSV, FLU A&B, COVID)  RVPGX2
Influenza A by PCR: NEGATIVE
Influenza B by PCR: NEGATIVE
Resp Syncytial Virus by PCR: NEGATIVE
SARS Coronavirus 2 by RT PCR: NEGATIVE

## 2024-07-29 MED ORDER — PREDNISONE 20 MG PO TABS
40.0000 mg | ORAL_TABLET | Freq: Once | ORAL | Status: AC
  Administered 2024-07-29: 40 mg via ORAL
  Filled 2024-07-29: qty 2

## 2024-07-29 MED ORDER — AIRSUPRA 90-80 MCG/ACT IN AERO: 1.0000 | INHALATION_SPRAY | RESPIRATORY_TRACT | 6 refills | Status: DC | PRN

## 2024-07-29 MED ORDER — IPRATROPIUM-ALBUTEROL 0.5-2.5 (3) MG/3ML IN SOLN
9.0000 mL | Freq: Once | RESPIRATORY_TRACT | Status: AC
  Administered 2024-07-29: 9 mL via RESPIRATORY_TRACT
  Filled 2024-07-29: qty 9

## 2024-07-29 MED ORDER — DOXYCYCLINE HYCLATE 100 MG PO TABS: 100.0000 mg | ORAL_TABLET | Freq: Two times a day (BID) | ORAL | 0 refills | Status: AC

## 2024-07-29 MED ORDER — PREDNISONE 20 MG PO TABS: 40.0000 mg | ORAL_TABLET | Freq: Every day | ORAL | 0 refills | Status: AC

## 2024-07-29 MED ORDER — DOXYCYCLINE HYCLATE 100 MG PO TABS
100.0000 mg | ORAL_TABLET | Freq: Once | ORAL | Status: AC
  Administered 2024-07-29: 100 mg via ORAL
  Filled 2024-07-29: qty 1

## 2024-07-29 NOTE — Discharge Instructions (Signed)
 Please take the medications as prescribed for your COPD exacerbation.  Please make sure to follow-up with primary care doctor early this week to get reassessed.

## 2024-07-29 NOTE — ED Notes (Signed)
 EKG taken to EDP. EDP made aware of patient acuity and vital signs, will go assess.

## 2024-07-29 NOTE — ED Triage Notes (Signed)
 Pt ambulatory to triage C/O SOB X 1 week. Reports stage IV lung cancer, not receiving treatment. Obviously dyspneic with accessory muscle use in triage. Refuses blood draw/IV unless life or death

## 2024-07-29 NOTE — ED Provider Notes (Signed)
 [facility] Provider Note    Event Date/Time   First MD Initiated Contact with Patient 07/29/24 1028     (approximate)   History   Shortness of Breath   HPI  Ian Taylor is a 64 y.o. male with history of lung cancer in remission, COPD not on O2, presenting with shortness of breath for the past week.  Denies any chest pain, no fever.  Denies history of CHF.  Also been having a cough that is new for the past week.  No congestion.  States that he is followed by oncology at [facility] for his cancer.  States that he had chemo for 2 years and is no longer any treatment.   On independent chart review, he was seen by oncology in August, had history of metastatic lung cancer completed chemotherapy in 2022, had recent CT that did not show evidence of disease recurrence.     Physical Exam   Triage Vital Signs: ED Triage Vitals  Encounter Vitals Group     BP 07/29/24 1021 (!) 88/72     Girls Systolic BP Percentile --      Girls Diastolic BP Percentile --      Boys Systolic BP Percentile --      Boys Diastolic BP Percentile --      Pulse Rate 07/29/24 1021 98     Resp 07/29/24 1021 (!) 28     Temp 07/29/24 1021 98.4 F (36.9 C)     Temp Source 07/29/24 1021 Oral     SpO2 07/29/24 1021 92 %     Weight --      Height --      Head Circumference --      Peak Flow --      Pain Score 07/29/24 1031 0     Pain Loc --      Pain Education --      Exclude from Growth Chart --     Most recent vital signs: Vitals:   07/29/24 1021 07/29/24 1234  BP: (!) 88/72 121/80  Pulse: 98 86  Resp: (!) 28 (!) 22  Temp: 98.4 F (36.9 C) 98.7 F (37.1 C)  SpO2: 92% 97%     General: Awake, no distress.  CV:  Good peripheral perfusion.  Resp:  Normal effort.  Diminished breath sounds bilaterally with sparse wheezing, tachypneic with increased work of breathing Abd:  No distention.  Soft nontender Other:  No lower extremity edema, no unilateral calf swelling or  tenderness   ED Results / Procedures / Treatments   Labs (all labs ordered are listed, but only abnormal results are displayed) Labs Reviewed  RESP PANEL BY RT-PCR (RSV, FLU A&B, COVID)  [id number]     EKG  EKG shows, sinus rhythm, rate 92, normal QS, normal QTc, no obvious ischemic ST elevation, T wave flattening in aVL, not significantly changed compared to prior   RADIOLOGY On my independent interpretation, chest x-ray shows right-sided opacity that was read as right upper lobe scarring.   PROCEDURES:  Critical Care performed: No  Procedures   MEDICATIONS ORDERED IN ED: Medications  ipratropium-albuterol  (DUONEB) 0.5-2.5 (3) MG/3ML nebulizer solution 9 mL (9 mLs Nebulization Given 07/29/24 1055)  predniSONE  (DELTASONE ) tablet 40 mg (40 mg Oral Given 07/29/24 1054)  doxycycline  (VIBRA -TABS) tablet 100 mg (100 mg Oral Given 07/29/24 1122)     IMPRESSION / MDM / ASSESSMENT AND PLAN / ED COURSE  I reviewed the triage vital signs and  the nursing notes.                              Differential diagnosis includes, but is not limited to, COPD exacerbation, RSV, influenza, COVID, pneumonia, did consider CHF but patient does not appear volume overloaded at this time.  They also considered PE given his history of prior lung cancer but he is in remission, no evidence of recurrence on recent CT, no unilateral calf swelling or tenderness, also with cough and diminished breath sounds with sparse wheezing bilaterally, favor COPD exacerbation and infectious process above PE at this time.  Patient is refusing labs at this time.  States that needles making very anxious.  Initially stated that he will walk out if we do labs.  Is agreeing to stay at this time for DuoNebs, p.o. steroids and p.o. antibiotics, and chest x-ray.  States that if the initial round of DuoNebs do not help his breathing or if he becomes hypotensive, he is willing to do labs at that time.  Repeat blood pressures in the room  were systolic 120s.  Patient's presentation is most consistent with acute presentation with potential threat to life or bodily function.  Independent interpretation of labs and imaging below.  On multiple reassessments, patient's breathing is a lot better, not hypoxic.  Blood pressures and is stable.  Did offer admission for COPD exacerbation but he declined, states that his breathing is a lot better now and would like to go home.  States that he can follow-up with his primary care doctor early this week to get reassessed.  Needs refill for his albuterol  inhaler.  Will also give him steroids as well as doxycycline  for COPD exacerbation.  At this time I do believe is reasonable for him to be followed up outpatient.  Will discharge with strict return precautions.  Shared decision making done with patient and he is agreeable with this plan.    Clinical Course as of 07/29/24 1322  [place] Jul 29, 2024  1119 DG Chest [place] 1 View if patient is in a treatment room. Emphysema and right upper lobe scarring. No acute findings.  [TT]  1206 No longer tachycardia neck or having increased work of breathing, able to hear improved air movement bilaterally.  Satting 97% on room air. [TT]  1233 Resp panel by RT-PCR (RSV, Flu A&B, Covid) Anterior Nasal Swab Negative [TT]    Clinical Course User Index [TT] Waymond Lorelle Cummins, MD     FINAL CLINICAL IMPRESSION(S) / ED DIAGNOSES   Final diagnoses:  COPD exacerbation (HCC)  SOB (shortness of breath)  Cough, unspecified type     Rx / DC Orders   ED Discharge Orders          Ordered    predniSONE  (DELTASONE ) 20 MG tablet  Daily with breakfast        07/29/24 1321    doxycycline  (VIBRA -TABS) 100 MG tablet  2 times daily        07/29/24 1321    Albuterol -Budesonide (AIRSUPRA ) 90-80 MCG/ACT AERO  Every 4 hours PRN        07/29/24 1321             Note:  This document was prepared using Dragon voice recognition software and may include unintentional  dictation errors.    Waymond Lorelle Cummins, MD 07/29/24 1322

## 2024-08-13 ENCOUNTER — Emergency Department

## 2024-08-13 ENCOUNTER — Encounter: Payer: Self-pay | Admitting: Internal Medicine

## 2024-08-13 ENCOUNTER — Other Ambulatory Visit: Payer: Self-pay

## 2024-08-13 ENCOUNTER — Inpatient Hospital Stay
Admission: EM | Admit: 2024-08-13 | Discharge: 2024-08-16 | DRG: 871 | Disposition: A | Discharge status: Home / Self Care | Attending: Internal Medicine | Admitting: Internal Medicine

## 2024-08-13 DIAGNOSIS — F17210 Nicotine dependence, cigarettes, uncomplicated: Secondary | ICD-10-CM | POA: Diagnosis present

## 2024-08-13 DIAGNOSIS — J449 Chronic obstructive pulmonary disease, unspecified: Secondary | ICD-10-CM | POA: Diagnosis present

## 2024-08-13 DIAGNOSIS — R6520 Severe sepsis without septic shock: Secondary | ICD-10-CM | POA: Diagnosis present

## 2024-08-13 DIAGNOSIS — J9621 Acute and chronic respiratory failure with hypoxia: Principal | ICD-10-CM | POA: Diagnosis present

## 2024-08-13 DIAGNOSIS — B338 Other specified viral diseases: Secondary | ICD-10-CM

## 2024-08-13 DIAGNOSIS — J189 Pneumonia, unspecified organism: Secondary | ICD-10-CM | POA: Diagnosis not present

## 2024-08-13 DIAGNOSIS — J159 Unspecified bacterial pneumonia: Secondary | ICD-10-CM | POA: Diagnosis present

## 2024-08-13 DIAGNOSIS — Z1152 Encounter for screening for COVID-19: Secondary | ICD-10-CM

## 2024-08-13 DIAGNOSIS — Z85118 Personal history of other malignant neoplasm of bronchus and lung: Secondary | ICD-10-CM

## 2024-08-13 DIAGNOSIS — A419 Sepsis, unspecified organism: Principal | ICD-10-CM | POA: Diagnosis present

## 2024-08-13 DIAGNOSIS — J440 Chronic obstructive pulmonary disease with acute lower respiratory infection: Secondary | ICD-10-CM | POA: Diagnosis present

## 2024-08-13 DIAGNOSIS — B974 Respiratory syncytial virus as the cause of diseases classified elsewhere: Secondary | ICD-10-CM | POA: Diagnosis present

## 2024-08-13 LAB — COMPREHENSIVE METABOLIC PANEL WITH GFR
ALT: 17 U/L (ref 0–44)
AST: 20 U/L (ref 15–41)
Albumin: 4.1 g/dL (ref 3.5–5.0)
Alkaline Phosphatase: 87 U/L (ref 38–126)
Anion gap: 13 (ref 5–15)
BUN: 16 mg/dL (ref 8–23)
CO2: 22 mmol/L (ref 22–32)
Calcium: 9.2 mg/dL (ref 8.9–10.3)
Chloride: 98 mmol/L (ref 98–111)
Creatinine, Ser: 1.29 mg/dL — ABNORMAL HIGH (ref 0.61–1.24)
GFR, Estimated: >60 mL/min
Glucose, Bld: 89 mg/dL (ref 70–99)
Potassium: 4.5 mmol/L (ref 3.5–5.1)
Sodium: 133 mmol/L — ABNORMAL LOW (ref 135–145)
Total Bilirubin: 0.8 mg/dL (ref 0.0–1.2)
Total Protein: 7.3 g/dL (ref 6.5–8.1)

## 2024-08-13 LAB — CBC WITH DIFFERENTIAL/PLATELET
Abs Immature Granulocytes: 0.14 10*3/uL — ABNORMAL HIGH (ref 0.00–0.07)
Basophils Absolute: 0.1 10*3/uL (ref 0.0–0.1)
Basophils Relative: 0 %
Eosinophils Absolute: 0 10*3/uL (ref 0.0–0.5)
Eosinophils Relative: 0 %
HCT: 47.5 % (ref 39.0–52.0)
Hemoglobin: 15.6 g/dL (ref 13.0–17.0)
Immature Granulocytes: 1 %
Lymphocytes Relative: 10 %
Lymphs Abs: 1.8 10*3/uL (ref 0.7–4.0)
MCH: 31.2 pg (ref 26.0–34.0)
MCHC: 32.8 g/dL (ref 30.0–36.0)
MCV: 95 fL (ref 80.0–100.0)
Monocytes Absolute: 1.3 10*3/uL — ABNORMAL HIGH (ref 0.1–1.0)
Monocytes Relative: 7 %
Neutro Abs: 15.1 10*3/uL — ABNORMAL HIGH (ref 1.7–7.7)
Neutrophils Relative %: 82 %
Platelets: 288 10*3/uL (ref 150–400)
RBC: 5 MIL/uL (ref 4.22–5.81)
RDW: 12.7 % (ref 11.5–15.5)
WBC: 18.4 10*3/uL — ABNORMAL HIGH (ref 4.0–10.5)
nRBC: 0 % (ref 0.0–0.2)

## 2024-08-13 LAB — LACTIC ACID, PLASMA: Lactic Acid, Venous: 1.7 mmol/L (ref 0.5–1.9)

## 2024-08-13 LAB — EXPECTORATED SPUTUM ASSESSMENT W GRAM STAIN, RFLX TO RESP C

## 2024-08-13 LAB — RESP PANEL BY RT-PCR (RSV, FLU A&B, COVID)  RVPGX2
Influenza A by PCR: NEGATIVE
Influenza B by PCR: NEGATIVE
Resp Syncytial Virus by PCR: POSITIVE — AB
SARS Coronavirus 2 by RT PCR: NEGATIVE

## 2024-08-13 MED ORDER — BUDESONIDE 0.25 MG/2ML IN SUSP
0.2500 mg | Freq: Two times a day (BID) | RESPIRATORY_TRACT | Status: DC
  Administered 2024-08-13 – 2024-08-16 (×7): 0.25 mg via RESPIRATORY_TRACT
  Filled 2024-08-13 (×7): qty 2

## 2024-08-13 MED ORDER — SODIUM CHLORIDE 0.9 % IV SOLN
100.0000 mg | Freq: Once | INTRAVENOUS | Status: AC
  Administered 2024-08-13: 100 mg via INTRAVENOUS
  Filled 2024-08-13: qty 100

## 2024-08-13 MED ORDER — SODIUM CHLORIDE 0.9 % IV SOLN: 500.0000 mg | INTRAVENOUS | Status: DC

## 2024-08-13 MED ORDER — METHYLPREDNISOLONE SODIUM SUCC 125 MG IJ SOLR
125.0000 mg | Freq: Once | INTRAMUSCULAR | Status: AC
  Administered 2024-08-13: 125 mg via INTRAVENOUS
  Filled 2024-08-13: qty 2

## 2024-08-13 MED ORDER — SODIUM CHLORIDE 0.9 % IV SOLN
1.0000 g | Freq: Once | INTRAVENOUS | Status: AC
  Administered 2024-08-13: 1 g via INTRAVENOUS
  Filled 2024-08-13: qty 10

## 2024-08-13 MED ORDER — METHYLPREDNISOLONE SODIUM SUCC 40 MG IJ SOLR
40.0000 mg | Freq: Two times a day (BID) | INTRAMUSCULAR | Status: DC
  Administered 2024-08-13 – 2024-08-14 (×2): 40 mg via INTRAVENOUS
  Filled 2024-08-13 (×2): qty 1

## 2024-08-13 MED ORDER — DOCUSATE SODIUM 100 MG PO CAPS
100.0000 mg | ORAL_CAPSULE | Freq: Two times a day (BID) | ORAL | Status: DC | PRN
  Administered 2024-08-14 – 2024-08-15 (×2): 100 mg via ORAL
  Filled 2024-08-13 (×2): qty 1

## 2024-08-13 MED ORDER — ALBUTEROL SULFATE (2.5 MG/3ML) 0.083% IN NEBU
3.0000 mL | INHALATION_SOLUTION | Freq: Four times a day (QID) | RESPIRATORY_TRACT | Status: DC | PRN
  Administered 2024-08-14: 3 mL via RESPIRATORY_TRACT
  Filled 2024-08-13 (×2): qty 3

## 2024-08-13 MED ORDER — IPRATROPIUM-ALBUTEROL 0.5-2.5 (3) MG/3ML IN SOLN
3.0000 mL | Freq: Four times a day (QID) | RESPIRATORY_TRACT | Status: DC
  Administered 2024-08-13 – 2024-08-15 (×7): 3 mL via RESPIRATORY_TRACT
  Filled 2024-08-13 (×7): qty 3

## 2024-08-13 MED ORDER — ACETAMINOPHEN 325 MG PO TABS: 650.0000 mg | ORAL_TABLET | Freq: Four times a day (QID) | ORAL | Status: DC | PRN

## 2024-08-13 MED ORDER — BENZONATATE 100 MG PO CAPS
100.0000 mg | ORAL_CAPSULE | Freq: Three times a day (TID) | ORAL | Status: DC | PRN
  Administered 2024-08-14: 100 mg via ORAL
  Filled 2024-08-13 (×2): qty 1

## 2024-08-13 MED ORDER — SODIUM CHLORIDE 0.9 % IV SOLN
100.0000 mg | Freq: Two times a day (BID) | INTRAVENOUS | Status: DC
  Administered 2024-08-14 – 2024-08-16 (×5): 100 mg via INTRAVENOUS
  Filled 2024-08-13 (×6): qty 100

## 2024-08-13 MED ORDER — GUAIFENESIN ER 600 MG PO TB12
600.0000 mg | ORAL_TABLET | Freq: Two times a day (BID) | ORAL | Status: DC
  Administered 2024-08-13 – 2024-08-16 (×7): 600 mg via ORAL
  Filled 2024-08-13 (×7): qty 1

## 2024-08-13 MED ORDER — IPRATROPIUM-ALBUTEROL 0.5-2.5 (3) MG/3ML IN SOLN
3.0000 mL | Freq: Once | RESPIRATORY_TRACT | Status: AC
  Administered 2024-08-13: 3 mL via RESPIRATORY_TRACT
  Filled 2024-08-13: qty 3

## 2024-08-13 MED ORDER — MAGNESIUM SULFATE 2 GM/50ML IV SOLN
2.0000 g | Freq: Once | INTRAVENOUS | Status: AC
  Administered 2024-08-13: 2 g via INTRAVENOUS
  Filled 2024-08-13: qty 50

## 2024-08-13 MED ORDER — SODIUM CHLORIDE 0.9 % IV SOLN
2.0000 g | INTRAVENOUS | Status: DC
  Administered 2024-08-14 – 2024-08-15 (×2): 2 g via INTRAVENOUS
  Filled 2024-08-13 (×3): qty 20

## 2024-08-13 MED ORDER — ENOXAPARIN SODIUM 40 MG/0.4ML IJ SOSY
40.0000 mg | PREFILLED_SYRINGE | INTRAMUSCULAR | Status: DC
  Administered 2024-08-13 – 2024-08-15 (×3): 40 mg via SUBCUTANEOUS
  Filled 2024-08-13 (×3): qty 0.4

## 2024-08-13 MED ORDER — ONDANSETRON HCL 4 MG/2ML IJ SOLN
4.0000 mg | Freq: Once | INTRAMUSCULAR | Status: AC
  Administered 2024-08-13: 4 mg via INTRAVENOUS
  Filled 2024-08-13: qty 2

## 2024-08-13 MED ORDER — ONDANSETRON HCL 4 MG/2ML IJ SOLN: 4.0000 mg | Freq: Four times a day (QID) | INTRAMUSCULAR | Status: DC | PRN

## 2024-08-13 MED ORDER — SODIUM CHLORIDE 0.9 % IV SOLN: 500.0000 mg | Freq: Once | INTRAVENOUS | Status: DC

## 2024-08-13 MED ORDER — MELATONIN 5 MG PO TABS
5.0000 mg | ORAL_TABLET | Freq: Every evening | ORAL | Status: DC | PRN
  Administered 2024-08-14 – 2024-08-15 (×2): 5 mg via ORAL
  Filled 2024-08-13 (×2): qty 1

## 2024-08-13 NOTE — H&P (Addendum)
 " History and Physical    Ian Taylor FMW:[id number] DOB: 12-15-1960 DOA: 08/13/2024  PCP: Donal Channing SQUIBB, FNP (Confirm with patient/family/NH records and if not entered, this has to be entered at [facility] point of entry) Patient coming from: Home  I have personally briefly reviewed patient's old medical records in [facility]  Chief Complaint: Cough, wheezing, shortness of breath, fever  HPI: Ian Taylor is a 64 y.o. male with medical history significant of COPD, metastatic NSCLC status post chemoimmunotherapy 2020-2022, cigarette smoking, presented with worsening of cough wheezing shortness of breath.  Symptoms started 3 to 4 weeks ago when patient started to have first likely a URI process of runny nose sore throat, headache and congestion, followed by dry cough and productive cough with clear phlegm, last few days has been experiencing more wheezing and increasing exertional dyspnea.  Has had cycles of fever and chills.  Denies any chest pain no nauseous vomiting no abdominal pain or diarrhea.  Claims that he smokes occasionally.  ED Course: Temperature 100.2 pulse rate 107, blood pressure 93/70 O2 saturation 95% room air.  Chest x-ray suspicious for bilateral lower field infiltrates, blood work showed WBC 18.5 hemoglobin 15.6 BUN 16 creatinine 1.2 bicarb 22K 4.5.  Nasal swab positive for RSV  Patient was given IV Solu-Medrol , multiple rounds of DuoNebs and antibiotics ceftriaxone  and azithromycin.  Review of Systems: As per HPI otherwise 14 point review of systems negative.    Past Medical History:  Diagnosis Date   Cancer (HCC)    Chronic bronchitis (HCC)    Emphysema of lung (HCC)    GERD (gastroesophageal reflux disease)    Wears glasses     Past Surgical History:  Procedure Laterality Date   CERVICAL DISCECTOMY     COLONOSCOPY     INGUINAL HERNIA REPAIR Left 09/18/2014   Procedure: LAPAROSCOPIC LEFT INGUINAL HERNIA REPAIR WITH MESH;  Surgeon: Vicenta Poli,  MD;  Location: [facility];  Service: General;  Laterality: Left;   INSERTION OF MESH N/A 09/18/2014   Procedure: INSERTION OF MESH;  Surgeon: Vicenta Poli, MD;  Location: [facility];  Service: General;  Laterality: N/A;   TONSILLECTOMY       reports that he has been smoking cigarettes. He does not have any smokeless tobacco history on file. He reports current alcohol use. He reports that he does not use drugs.  Allergies[1]  No family history on file.   Prior to Admission medications  Medication Sig Start Date End Date Taking? Authorizing Provider  acetaminophen  (TYLENOL ) 325 MG tablet Take 650 mg by mouth every 6 (six) hours as needed.    [provider]  Albuterol -Budesonide  (AIRSUPRA ) 90-80 MCG/ACT AERO Inhale 1-2 puffs into the lungs every 4 (four) hours as needed. 07/29/24   Waymond Lorelle Cummins, MD  benzonatate  (TESSALON  PERLES) 100 MG capsule Take 1 capsule (100 mg total) by mouth 3 (three) times daily as needed for cough. 02/11/24 02/10/25  Clarine Ozell LABOR, MD  chlorpheniramine-HYDROcodone (TUSSIONEX PENNKINETIC ER) 10-8 MG/5ML SUER Take 5 mLs by mouth every 12 (twelve) hours as needed for cough. 09/29/15   Brain Redell RAMAN, MD  naproxen sodium (ANAPROX) 220 MG tablet Take 220 mg by mouth 2 (two) times daily with a meal.    [provider]  oxyCODONE -acetaminophen  (ROXICET) 5-325 MG per tablet Take 1-2 tablets by mouth every 4 (four) hours as needed for severe pain. 09/18/14   Poli Vicenta, MD  Probiotic Product (PROBIOTIC & ACIDOPHILUS  EX ST) CAPS Take by mouth 2 (two) times daily.    [provider]    Physical Exam: Vitals:   08/13/24 1004 08/13/24 1005 08/13/24 1007  BP:   93/69  Pulse:   (!) 107  Resp:   (!) 23  Temp:   100.2 F (37.9 C)  TempSrc:   Oral  SpO2: 99%  97%  Weight:  67.1 kg   Height:  5' 8 (1.727 m)     Constitutional: NAD, calm, comfortable Vitals:   08/13/24 1004 08/13/24 1005 08/13/24 1007  BP:    93/69  Pulse:   (!) 107  Resp:   (!) 23  Temp:   100.2 F (37.9 C)  TempSrc:   Oral  SpO2: 99%  97%  Weight:  67.1 kg   Height:  5' 8 (1.727 m)    Eyes: PERRL, lids and conjunctivae normal ENMT: Mucous membranes are moist. Posterior pharynx clear of any exudate or lesions.Normal dentition.  Neck: normal, supple, no masses, no thyromegaly Respiratory: Diminished breathing sound bilaterally, diffused wheezing, scattered crackles bilaterally 1 increasing respiratory effort. No accessory muscle use.  Cardiovascular: Regular rate and rhythm, no murmurs / rubs / gallops. No extremity edema. 2+ pedal pulses. No carotid bruits.  Abdomen: no tenderness, no masses palpated. No hepatosplenomegaly. Bowel sounds positive.  Musculoskeletal: no clubbing / cyanosis. No joint deformity upper and lower extremities. Good ROM, no contractures. Normal muscle tone.  Skin: no rashes, lesions, ulcers. No induration Neurologic: CN 2-12 grossly intact. Sensation intact, DTR normal. Strength 5/5 in all 4.  Psychiatric: Normal judgment and insight. Alert and oriented x 3. Normal mood.    Labs on Admission: I have personally reviewed following labs and imaging studies  CBC: Recent Labs  Lab 08/13/24 1038  WBC 18.4*  NEUTROABS 15.1*  HGB 15.6  HCT 47.5  MCV 95.0  PLT 288   Basic Metabolic Panel: Recent Labs  Lab 08/13/24 1038  NA 133*  K 4.5  CL 98  CO2 22  GLUCOSE 89  BUN 16  CREATININE 1.29*  CALCIUM 9.2   GFR: Estimated Creatinine Clearance: 54.9 mL/min (A) (by C-G formula based on SCr of 1.29 mg/dL (H)). Liver Function Tests: Recent Labs  Lab 08/13/24 1038  AST 20  ALT 17  ALKPHOS 87  BILITOT 0.8  PROT 7.3  ALBUMIN 4.1   No results for input(s): LIPASE, AMYLASE in the last 168 hours. No results for input(s): AMMONIA in the last 168 hours. Coagulation Profile: No results for input(s): INR, PROTIME in the last 168 hours. Cardiac Enzymes: No results for input(s):  CKTOTAL, CKMB, CKMBINDEX, TROPONINI in the last 168 hours. BNP (last 3 results) No results for input(s): PROBNP in the last 8760 hours. HbA1C: No results for input(s): HGBA1C in the last 72 hours. CBG: No results for input(s): GLUCAP in the last 168 hours. Lipid Profile: No results for input(s): CHOL, HDL, LDLCALC, TRIG, CHOLHDL, LDLDIRECT in the last 72 hours. Thyroid Function Tests: No results for input(s): TSH, T4TOTAL, FREET4, T3FREE, THYROIDAB in the last 72 hours. Anemia Panel: No results for input(s): VITAMINB12, FOLATE, FERRITIN, TIBC, IRON, RETICCTPCT in the last 72 hours. Urine analysis:    Component Value Date/Time   COLORURINE Yellow 12/19/2012 1810   APPEARANCEUR Clear 12/19/2012 1810   LABSPEC 1.020 12/19/2012 1810   PHURINE 6.0 12/19/2012 1810   GLUCOSEU Negative 12/19/2012 1810   HGBUR Negative 12/19/2012 1810   BILIRUBINUR Negative 12/19/2012 1810   KETONESUR 1+ 12/19/2012 1810  PROTEINUR Negative 12/19/2012 1810   NITRITE Negative 12/19/2012 1810   LEUKOCYTESUR Negative 12/19/2012 1810    Radiological Exams on Admission: DG Chest Port 1 View Result Date: 08/13/2024 EXAM: 1 VIEW(S) XRAY OF THE CHEST 08/13/2024 10:42:00 AM COMPARISON: 07/29/2024 and 07/23/2024. CLINICAL HISTORY: 64 year old male. Questionable sepsis. Evaluate for abnormality. FINDINGS: LUNGS AND PLEURA: Chronic lung disease with bullous emphysema, chronic lung nodules on prior CT in 2020. Persistent bibasilar patchy airspace opacities, asymmetric and lower lung predominant, are new or progressed since 07/23/2024 Right apical scarring. No consolidation. No pleural effusion. No pneumothorax. HEART AND MEDIASTINUM: No acute abnormality of the cardiac and mediastinal silhouettes. BONES AND SOFT TISSUES: Partially visualized cervical spinal fusion hardware in place. No acute osseous abnormality. IMPRESSION: 1. Chronic lung disease with evidence of basilar  predominant acute infectious exacerbation. 2. Emphysema (ICD10-J43.9). Electronically signed by: Helayne Hurst MD 08/13/2024 10:58 AM EST RP Workstation: [id number]    EKG: Independently reviewed.  Sinus tachycardia, no acute ST changes.  Assessment/Plan Principal Problem:   Pneumonia Active Problems:   COPD (chronic obstructive pulmonary disease) (HCC)   CAP (community acquired pneumonia)  (please populate well all problems here in Problem List. (For example, if patient is on BP meds at home and you resume or decide to hold them, it is a problem that needs to be her. Same for CAD, COPD, HLD and so on)  Acute COPD exacerbation - Continue IV Solu-Medrol  - Add Pulmicort  twice daily - DuoNebs and as needed albuterol  - Antibiotics as below - Incentive spirometry flutter valve - Ambulatory pulse ox  CAP, bacterial - Likely postviral pneumonia after RSV infection - Ceftriaxone  and azithromycin - Sputum culture - Other DDx, low suspicion for aspiration pneumonia.  Clinically patient appears to be euvolemic, no suspicion for CHF.  RSV infection - Breathing treatment - COVID-19 CDC guideline data points:  SIRS - Tachycardia tachypneic, leukocytosis, source infection and pneumonia.  Normal lactic acid, sepsis unlikely.  History of NSCLC status post chemoimmunotherapy 2020-2022 - Followed by oncology at [facility], CT chest every 6 months.  Cigarette smoking - Cessation education performed at bedside  DVT prophylaxis: Lovenox  Code Status: Full code Family Communication: None at bedside Disposition Plan: Expect less than 2 midnight hospital stay Consults called: None Admission status: Telemetry observation   Cort ONEIDA Mana MD [facility] Pager [phone]  08/13/2024, 11:49 AM        [1] No Known Allergies  "

## 2024-08-13 NOTE — Plan of Care (Signed)
   Problem: Education: Goal: Knowledge of General Education information will improve Description Including pain rating scale, medication(s)/side effects and non-pharmacologic comfort measures Outcome: Progressing

## 2024-08-13 NOTE — ED Notes (Signed)
 This RN spoke to Estate Manager/land Agent from 1A and let them know pt was on their way to the floor from the ED.

## 2024-08-13 NOTE — ED Provider Notes (Signed)
 "  [facility] Provider Note    Event Date/Time   First MD Initiated Contact with Patient 08/13/24 0957     (approximate)   History   Shortness of Breath   HPI  Ian Taylor is a 64 y.o. male with history of COPD, small cell lung cancer in remission who presents with complaints of shortness of breath.  Patient reports this has worsened over the last 24 hours.  He does not wear oxygen at home.  He is hypoxic on  arrival with elevated temperature and tachycardia     Physical Exam   Triage Vital Signs: ED Triage Vitals  Encounter Vitals Group     BP 08/13/24 1007 93/69     Girls Systolic BP Percentile --      Girls Diastolic BP Percentile --      Boys Systolic BP Percentile --      Boys Diastolic BP Percentile --      Pulse Rate 08/13/24 1007 (!) 107     Resp 08/13/24 1007 (!) 23     Temp 08/13/24 1007 100.2 F (37.9 C)     Temp Source 08/13/24 1007 Oral     SpO2 08/13/24 1004 99 %     Weight 08/13/24 1005 67.1 kg (148 lb)     Height 08/13/24 1005 1.727 m (5' 8)     Head Circumference --      Peak Flow --      Pain Score 08/13/24 1004 4     Pain Loc --      Pain Education --      Exclude from Growth Chart --     Most recent vital signs: Vitals:   08/13/24 1004 08/13/24 1007  BP:  93/69  Pulse:  (!) 107  Resp:  (!) 23  Temp:  100.2 F (37.9 C)  SpO2: 99% 97%     General: Awake, no distress.  CV:  Good peripheral perfusion.  Tachycardia Resp:  Increased respiratory effort with tachypnea, scattered wheezes, poor airflow Abd:  No distention.  Other:  No calf pain or swelling   ED Results / Procedures / Treatments   Labs (all labs ordered are listed, but only abnormal results are displayed) Labs Reviewed  RESP PANEL BY RT-PCR (RSV, FLU A&B, COVID)  RVPGX2 - Abnormal; Notable for the following components:      Result Value   Resp Syncytial Virus by PCR POSITIVE (*)    All other components within normal limits  COMPREHENSIVE  METABOLIC PANEL WITH GFR - Abnormal; Notable for the following components:   Sodium 133 (*)    Creatinine, Ser 1.29 (*)    All other components within normal limits  CBC WITH DIFFERENTIAL/PLATELET - Abnormal; Notable for the following components:   WBC 18.4 (*)    Neutro Abs 15.1 (*)    Monocytes Absolute 1.3 (*)    Abs Immature Granulocytes 0.14 (*)    All other components within normal limits  CULTURE, BLOOD (ROUTINE X 2)  CULTURE, BLOOD (ROUTINE X 2)  LACTIC ACID, PLASMA  LACTIC ACID, PLASMA  URINALYSIS, W/ REFLEX TO CULTURE (INFECTION SUSPECTED)     EKG  ED ECG REPORT I, Lamar Price, the attending physician, personally viewed and interpreted this ECG.  Date: 08/13/2024  Rhythm: normal sinus rhythm QRS Axis: normal Intervals: normal ST/T Wave abnormalities: normal Narrative Interpretation: no evidence of acute ischemia    RADIOLOGY Chest x-ray suspicious for bibasilar infiltrates    PROCEDURES:  Critical  Care performed: yes  CRITICAL CARE Performed by: Lamar Price   Total critical care time: 30 minutes  Critical care time was exclusive of separately billable procedures and treating other patients.  Critical care was necessary to treat or prevent imminent or life-threatening deterioration.  Critical care was time spent personally by me on the following activities: development of treatment plan with patient and/or surrogate as well as nursing, discussions with consultants, evaluation of patient's response to treatment, examination of patient, obtaining history from patient or surrogate, ordering and performing treatments and interventions, ordering and review of laboratory studies, ordering and review of radiographic studies, pulse oximetry and re-evaluation of patient's condition.   Procedures   MEDICATIONS ORDERED IN ED: Medications  cefTRIAXone  (ROCEPHIN ) 1 g in sodium chloride  0.9 % 100 mL IVPB (has no administration in time range)  azithromycin  (ZITHROMAX) 500 mg in sodium chloride  0.9 % 250 mL IVPB (has no administration in time range)  ipratropium-albuterol  (DUONEB) 0.5-2.5 (3) MG/3ML nebulizer solution 3 mL (3 mLs Nebulization Given 08/13/24 1030)  ipratropium-albuterol  (DUONEB) 0.5-2.5 (3) MG/3ML nebulizer solution 3 mL (3 mLs Nebulization Given 08/13/24 1030)  methylPREDNISolone  sodium succinate (SOLU-MEDROL ) 125 mg/2 mL injection 125 mg (125 mg Intravenous Given 08/13/24 1029)  magnesium  sulfate IVPB 2 g 50 mL (0 g Intravenous Stopped 08/13/24 1134)  ondansetron  (ZOFRAN ) injection 4 mg (4 mg Intravenous Given 08/13/24 1028)     IMPRESSION / MDM / ASSESSMENT AND PLAN / ED COURSE  I reviewed the triage vital signs and the nursing notes. Patient's presentation is most consistent with severe exacerbation of chronic illness.  Patient presents with shortness of breath, he is hypoxic on upon arrival, he does have an elevated temperature and tachycardia.  Differential includes COPD exacerbation, viral illness, pneumonia/sepsis  Have started nasal cannula oxygen, will treat with DuoNebs, Solu-Medrol , magnesium   Pending labs, chest x-ray  Lab work demonstrates elevated white blood cell count, positive RSV, chest x-ray suspicious for pneumonia, will treat with IV Rocephin , IV doxycycline  given azithromycin shortage per pharmacy  Have discussed with hospitalist for admission      FINAL CLINICAL IMPRESSION(S) / ED DIAGNOSES   Final diagnoses:  Acute on chronic respiratory failure with hypoxia (HCC)  Infection due to respiratory syncytial virus (RSV), unspecified infection type  Community acquired pneumonia, unspecified laterality     Rx / DC Orders   ED Discharge Orders     None        Note:  This document was prepared using Dragon voice recognition software and may include unintentional dictation errors.   Price Lamar, MD 08/13/24 1142  "

## 2024-08-13 NOTE — ED Triage Notes (Signed)
 BIBEMS from home. Pt c/o SOB that has been going on for about 4 weeks as well as a productive cough. Hx of lung cancer and COPD. Diminished lung sounds bilaterally. Pt also states they are nauseas. Per pt they feel more SOB w/ exertion, placed on 15L non-rebreather by EMS.   EMS VS: 143/94 112 HR 95% RA

## 2024-08-14 DIAGNOSIS — J189 Pneumonia, unspecified organism: Secondary | ICD-10-CM | POA: Diagnosis present

## 2024-08-14 DIAGNOSIS — Z1152 Encounter for screening for COVID-19: Secondary | ICD-10-CM | POA: Diagnosis not present

## 2024-08-14 DIAGNOSIS — J441 Chronic obstructive pulmonary disease with (acute) exacerbation: Secondary | ICD-10-CM | POA: Diagnosis not present

## 2024-08-14 DIAGNOSIS — A419 Sepsis, unspecified organism: Secondary | ICD-10-CM | POA: Diagnosis present

## 2024-08-14 DIAGNOSIS — J440 Chronic obstructive pulmonary disease with acute lower respiratory infection: Secondary | ICD-10-CM | POA: Diagnosis present

## 2024-08-14 DIAGNOSIS — F17210 Nicotine dependence, cigarettes, uncomplicated: Secondary | ICD-10-CM | POA: Diagnosis present

## 2024-08-14 DIAGNOSIS — R6520 Severe sepsis without septic shock: Secondary | ICD-10-CM | POA: Diagnosis present

## 2024-08-14 DIAGNOSIS — Z85118 Personal history of other malignant neoplasm of bronchus and lung: Secondary | ICD-10-CM | POA: Diagnosis not present

## 2024-08-14 DIAGNOSIS — B974 Respiratory syncytial virus as the cause of diseases classified elsewhere: Secondary | ICD-10-CM | POA: Diagnosis present

## 2024-08-14 DIAGNOSIS — J9621 Acute and chronic respiratory failure with hypoxia: Secondary | ICD-10-CM | POA: Diagnosis present

## 2024-08-14 DIAGNOSIS — J159 Unspecified bacterial pneumonia: Secondary | ICD-10-CM | POA: Diagnosis present

## 2024-08-14 LAB — URINALYSIS, W/ REFLEX TO CULTURE (INFECTION SUSPECTED)
Bacteria, UA: NONE SEEN
Bilirubin Urine: NEGATIVE
Glucose, UA: NEGATIVE mg/dL
Hgb urine dipstick: NEGATIVE
Ketones, ur: NEGATIVE mg/dL
Leukocytes,Ua: NEGATIVE
Nitrite: NEGATIVE
Protein, ur: NEGATIVE mg/dL
RBC / HPF: 0 RBC/hpf (ref 0–5)
Specific Gravity, Urine: 1.013 (ref 1.005–1.030)
Squamous Epithelial / HPF: 0 /HPF (ref 0–5)
pH: 5 (ref 5.0–8.0)

## 2024-08-14 LAB — STREP PNEUMONIAE URINARY ANTIGEN: Strep Pneumo Urinary Antigen: NEGATIVE

## 2024-08-14 LAB — LACTIC ACID, PLASMA
Lactic Acid, Venous: 3 mmol/L (ref 0.5–1.9)
Lactic Acid, Venous: 3.1 mmol/L (ref 0.5–1.9)

## 2024-08-14 LAB — CBC
HCT: 47.7 % (ref 39.0–52.0)
Hemoglobin: 15.3 g/dL (ref 13.0–17.0)
MCH: 31.4 pg (ref 26.0–34.0)
MCHC: 32.1 g/dL (ref 30.0–36.0)
MCV: 97.9 fL (ref 80.0–100.0)
Platelets: 173 10*3/uL (ref 150–400)
RBC: 4.87 MIL/uL (ref 4.22–5.81)
RDW: 12.6 % (ref 11.5–15.5)
WBC: 12.3 10*3/uL — ABNORMAL HIGH (ref 4.0–10.5)
nRBC: 0 % (ref 0.0–0.2)

## 2024-08-14 MED ORDER — PREDNISONE 20 MG PO TABS
40.0000 mg | ORAL_TABLET | Freq: Every day | ORAL | Status: DC
  Administered 2024-08-15 – 2024-08-16 (×2): 40 mg via ORAL
  Filled 2024-08-14 (×2): qty 2

## 2024-08-14 MED ORDER — TRAZODONE HCL 50 MG PO TABS
50.0000 mg | ORAL_TABLET | Freq: Once | ORAL | Status: AC
  Administered 2024-08-14: 50 mg via ORAL
  Filled 2024-08-14: qty 1

## 2024-08-14 MED ORDER — SODIUM CHLORIDE 0.9 % IV BOLUS
1000.0000 mL | Freq: Once | INTRAVENOUS | Status: AC
  Administered 2024-08-14: 1000 mL via INTRAVENOUS

## 2024-08-14 NOTE — Plan of Care (Signed)
  Problem: Education: Goal: Knowledge of General Education information will improve Description Including pain rating scale, medication(s)/side effects and non-pharmacologic comfort measures Outcome: Progressing   Problem: Health Behavior/Discharge Planning: Goal: Ability to manage health-related needs will improve Outcome: Progressing   Problem: Clinical Measurements: Goal: Will remain free from infection Outcome: Progressing   Problem: Clinical Measurements: Goal: Respiratory complications will improve Outcome: Progressing   Problem: Nutrition: Goal: Adequate nutrition will be maintained Outcome: Progressing   

## 2024-08-14 NOTE — Plan of Care (Signed)

## 2024-08-14 NOTE — Hospital Course (Addendum)
 Ian Taylor is a 64 y.o. male with medical history significant of COPD, metastatic NSCLC status post chemoimmunotherapy 2020-2022, cigarette smoking, presented with worsening of cough wheezing shortness of breath   HPI: Symptoms started 3 to 4 weeks ago when patient started to have first likely a URI process of runny nose sore throat, headache and congestion, followed by dry cough and productive cough with clear phlegm, last few days has been experiencing more wheezing and increasing exertional dyspnea. Has had cycles of fever and chills. Denies any chest pain no nauseous vomiting no abdominal pain or diarrhea. Claims that he smokes occasionally.   Hospital course / significant events: 01/26: to ED. Temperature 100.2 pulse rate 107, blood pressure 93/70 O2 saturation 95% room air.  Chest x-ray suspicious for bilateral lower field infiltrates, blood work showed WBC 18.5 hemoglobin 15.6 BUN 16 creatinine 1.2 bicarb 22K 4.5.  Nasal swab positive for RSV. Patient was given IV Solu-Medrol , multiple rounds of DuoNebs and antibiotics ceftriaxone  and azithromycin. Remain on 2L O2 overnight 01/27: room air this AM, but back on O2 shortly thereafter, very SOB on ambulation and O2 desat.      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  Clinically patient  appears to be euvolemic, no suspicion for CHF. Ceftriaxone  and doxycycline    RSV infection Supportive care - tx CAP and COPD as above   History of NSCLC status post chemoimmunotherapy 2020-2022 Followed by oncology at [facility], CT chest every 6 months.   Cigarette smoking Cessation education performed at bedside    No concerns based on BMI: Body mass index is 22.5 kg/m.SABRA Significantly low or high BMI is associated with higher medical risk.  Underweight - under 18  overweight - 25 to 29 obese - 30 or more Class 1 obesity: BMI of 30.0 to 34 Class 2 obesity: BMI of 35.0 to 39 Class 3 obesity: BMI of 40.0 to 49 Super Morbid Obesity: BMI 50-59 Super-super Morbid Obesity: BMI 60+ Healthy nutrition and physical activity advised as adjunct to other disease management and risk reduction treatments    DVT prophylaxis: lovenox  IV fluids: no continuous IV fluids  Nutrition: regular diet Central lines / other devices: none  Code Status: FULL CODE ACP documentation reviewed:  none on file in [facility]  TOC needs: TBD expect may need home O2 Medical barriers to dispo at this time: O2 requirement. Expected readiness for discharge per TOC at this time:

## 2024-08-14 NOTE — Progress Notes (Signed)
 " PROGRESS NOTE    Ian Taylor   FMW:[id number] DOB: 09-Mar-1961  DOA: 08/13/2024 Date of Service: 08/14/24 which is hospital day 0  PCP: Donal Channing SQUIBB, FNP    Ian Taylor is a 64 y.o. male with medical history significant of COPD, metastatic NSCLC status post chemoimmunotherapy 2020-2022, cigarette smoking, presented with worsening of cough wheezing shortness of breath   HPI: Symptoms started 3 to 4 weeks ago when patient started to have first likely a URI process of runny nose sore throat, headache and congestion, followed by dry cough and productive cough with clear phlegm, last few days has been experiencing more wheezing and increasing exertional dyspnea. Has had cycles of fever and chills. Denies any chest pain no nauseous vomiting no abdominal pain or diarrhea. Claims that he smokes occasionally.   Hospital course / significant events: 01/26: to ED. Temperature 100.2 pulse rate 107, blood pressure 93/70 O2 saturation 95% room air.  Chest x-ray suspicious for bilateral lower field infiltrates, blood work showed WBC 18.5 hemoglobin 15.6 BUN 16 creatinine 1.2 bicarb 22K 4.5.  Nasal swab positive for RSV. Patient was given IV Solu-Medrol , multiple rounds of DuoNebs and antibiotics ceftriaxone  and azithromycin. Remain on 2L O2 overnight 01/27: room air this AM, but back on O2 shortly thereafter, very SOB on ambulation and O2 desat.      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  Clinically patient appears to be euvolemic, no suspicion for CHF. Ceftriaxone  and doxycycline    RSV infection Supportive care - tx CAP and COPD as above   History of NSCLC status post chemoimmunotherapy 2020-2022 Followed by oncology at [facility], CT chest every 6 months.   Cigarette smoking Cessation education performed at bedside    No concerns based on BMI: Body mass index is 22.5 kg/m.SABRA Significantly low or high BMI is associated with higher medical risk.  Underweight - under 18  overweight - 25 to 29 obese - 30 or more Class 1 obesity: BMI of 30.0 to 34 Class 2 obesity: BMI of 35.0 to 39 Class 3 obesity: BMI of 40.0 to 49 Super Morbid Obesity: BMI 50-59 Super-super Morbid Obesity: BMI 60+ Healthy nutrition and physical activity advised as adjunct to other disease management and risk reduction treatments    DVT prophylaxis: lovenox  IV fluids: no continuous IV fluids  Nutrition: regular diet Central lines / other devices: none  Code Status: FULL CODE ACP documentation reviewed:  none on file in [facility]  TOC needs: TBD expect may need home O2 Medical barriers to dispo at this time: O2 requirement. Expected readiness for discharge per TOC at this time:               Subjective / Brief ROS:  Patient reports SOB w/ minimal exertion Denies CP/SOB at rest Asks about FMLA paperwork - I instructed him to  have his HR dept fax us  any forms needing completed Pain controlled.  Denies new weakness.  Tolerating diet.  Reports no concerns w/ urination/defecation.   Family Communication: none at this time     Objective Findings:  Vitals:   08/14/24 0320 08/14/24 0435 08/14/24 0716 08/14/24 0735  BP:  120/85 106/70   Pulse:  77 73   Resp:  20 17   Temp:  98 F (36.7 C)    TempSrc:      SpO2: 96% 99%  100% 96%  Weight:      Height:        Intake/Output Summary (Last 24 hours) at 08/14/2024 1451 Last data filed at 08/14/2024 1300 Gross per 24 hour  Intake 590 ml  Output --  Net 590 ml   Filed Weights   08/13/24 1005  Weight: 67.1 kg    Examination:  Physical Exam Constitutional:      General: He is not in acute distress. Cardiovascular:     Rate and Rhythm: Normal rate and regular rhythm.  Pulmonary:     Breath sounds: Decreased breath sounds present.  Musculoskeletal:     Right lower leg: No edema.     Left lower leg: No edema.  Neurological:     Mental Status: He is alert.          Scheduled Medications:   budesonide  (PULMICORT ) nebulizer solution  0.25 mg Nebulization BID   enoxaparin  (LOVENOX ) injection  40 mg Subcutaneous Q24H   guaiFENesin   600 mg Oral BID   ipratropium-albuterol   3 mL Nebulization Q6H   [START ON 08/15/2024] predniSONE   40 mg Oral Q breakfast    Continuous Infusions:  cefTRIAXone  (ROCEPHIN )  IV 2 g (08/14/24 0934)   doxycycline  (VIBRAMYCIN ) IV 100 mg (08/14/24 1253)    PRN Medications:  acetaminophen , albuterol , benzonatate , docusate sodium , melatonin, ondansetron  (ZOFRAN ) IV  Antimicrobials from admission:  Anti-infectives (From admission, onward)    Start     Dose/Rate Route Frequency Ordered Stop   08/14/24 1000  cefTRIAXone  (ROCEPHIN ) 2 g in sodium chloride  0.9 % 100 mL IVPB        2 g 200 mL/hr over 30 Minutes Intravenous Every 24 hours 08/13/24 1147 08/19/24 0959   08/14/24 1000  azithromycin (ZITHROMAX) 500 mg in sodium chloride  0.9 % 250 mL IVPB  Status:  Discontinued        500 mg 250 mL/hr over 60 Minutes Intravenous Every 24 hours 08/13/24 1147 08/13/24 1211   08/14/24 0000  doxycycline  (VIBRAMYCIN ) 100 mg in sodium chloride  0.9 % 250 mL IVPB        100 mg 125 mL/hr over 120 Minutes Intravenous Every 12 hours 08/13/24 1211     08/13/24 1215  doxycycline  (VIBRAMYCIN ) 100 mg in sodium chloride  0.9 % 250 mL IVPB         100 mg 125 mL/hr over 120 Minutes Intravenous  Once 08/13/24 1206 08/13/24 1423   08/13/24 1130  cefTRIAXone  (ROCEPHIN ) 1 g in sodium chloride  0.9 % 100 mL IVPB        1 g 200 mL/hr over 30 Minutes Intravenous  Once 08/13/24 1128 08/13/24 1217   08/13/24 1130  azithromycin (ZITHROMAX) 500 mg in sodium chloride  0.9 % 250 mL IVPB  Status:  Discontinued        500 mg 250 mL/hr over 60 Minutes Intravenous  Once 08/13/24 1128 08/13/24 1206           Data Reviewed:  I have personally reviewed  the following...  CBC: Recent Labs  Lab 08/13/24 1038 08/14/24 0459  WBC 18.4* 12.3*  NEUTROABS 15.1*  --   HGB 15.6 15.3  HCT 47.5 47.7  MCV 95.0 97.9  PLT 288 173   Basic Metabolic Panel: Recent Labs  Lab 08/13/24 1038  NA 133*  K 4.5  CL 98  CO2 22  GLUCOSE 89  BUN 16  CREATININE 1.29*  CALCIUM 9.2   GFR: Estimated Creatinine Clearance: 54.9 mL/min (A) (by C-G formula based on SCr of 1.29 mg/dL (H)). Liver Function Tests: Recent Labs  Lab 08/13/24 1038  AST 20  ALT 17  ALKPHOS 87  BILITOT 0.8  PROT 7.3  ALBUMIN 4.1   No results for input(s): LIPASE, AMYLASE in the last 168 hours. No results for input(s): AMMONIA in the last 168 hours. Coagulation Profile: No results for input(s): INR, PROTIME in the last 168 hours. Cardiac Enzymes: No results for input(s): CKTOTAL, CKMB, CKMBINDEX, TROPONINI in the last 168 hours. BNP (last 3 results) No results for input(s): PROBNP in the last 8760 hours. HbA1C: No results for input(s): HGBA1C in the last 72 hours. CBG: No results for input(s): GLUCAP in the last 168 hours. Lipid Profile: No results for input(s): CHOL, HDL, LDLCALC, TRIG, CHOLHDL, LDLDIRECT in the last 72 hours. Thyroid Function Tests: No results for input(s): TSH, T4TOTAL, FREET4, T3FREE, THYROIDAB in the last 72 hours. Anemia Panel: No results for input(s): VITAMINB12, FOLATE, FERRITIN, TIBC, IRON,  RETICCTPCT in the last 72 hours. Most Recent Urinalysis On File:     Component Value Date/Time   COLORURINE YELLOW (A) 08/14/2024 0420   APPEARANCEUR CLEAR (A) 08/14/2024 0420   APPEARANCEUR Clear 12/19/2012 1810   LABSPEC 1.013 08/14/2024 0420   LABSPEC 1.020 12/19/2012 1810   PHURINE 5.0 08/14/2024 0420   GLUCOSEU NEGATIVE 08/14/2024 0420   GLUCOSEU Negative 12/19/2012 1810   HGBUR NEGATIVE 08/14/2024 0420   BILIRUBINUR NEGATIVE 08/14/2024 0420   BILIRUBINUR Negative 12/19/2012 1810   KETONESUR NEGATIVE 08/14/2024 0420   PROTEINUR NEGATIVE 08/14/2024 0420   NITRITE NEGATIVE 08/14/2024 0420   LEUKOCYTESUR NEGATIVE 08/14/2024 0420   LEUKOCYTESUR Negative 12/19/2012 1810   Sepsis Labs: @LABRCNTIP (procalcitonin:4,lacticidven:4) Microbiology: Recent Results (from the past 240 hours)  Resp panel by RT-PCR (RSV, Flu A&B, Covid) Anterior Nasal Swab     Status: Abnormal   Collection Time: 08/13/24 10:17 AM   Specimen: Anterior Nasal Swab  Result Value Ref Range Status   SARS Coronavirus 2 by RT PCR NEGATIVE NEGATIVE Final    Comment: (NOTE) SARS-CoV-2 target nucleic acids are NOT DETECTED.  The SARS-CoV-2 RNA is generally detectable in upper respiratory specimens during the acute phase of infection. The lowest concentration of SARS-CoV-2 viral copies this assay can detect is 138 copies/mL. A negative result does not preclude SARS-Cov-2 infection and should not be used as the sole basis for treatment or other patient management decisions. A negative result may occur with  improper specimen collection/handling, submission of specimen other than nasopharyngeal swab, presence of viral mutation(s) within the areas targeted by this assay, and inadequate number of viral copies(<138 copies/mL). A negative result must be combined with clinical observations, patient history, and epidemiological information. The expected result is Negative.  Fact Sheet for Patients:   bloggercourse.com  Fact Sheet for Healthcare Providers:  seriousbroker.it  This test is no t yet approved or cleared by the [place]  FDA and  has been authorized for detection and/or diagnosis of SARS-CoV-2 by FDA under an Emergency Use Authorization (EUA).  This EUA will remain  in effect (meaning this test can be used) for the duration of the COVID-19 declaration under Section 564(b)(1) of the Act, 21 U.S.C.section 360bbb-3(b)(1), unless the authorization is terminated  or revoked sooner.       Influenza A by PCR NEGATIVE NEGATIVE Final   Influenza B by PCR NEGATIVE NEGATIVE Final    Comment: (NOTE) The Xpert Xpress SARS-CoV-2/FLU/RSV plus assay is intended as an aid in the diagnosis of influenza from Nasopharyngeal swab specimens and should not be used as a sole basis for treatment. Nasal washings and aspirates are unacceptable for Xpert Xpress SARS-CoV-2/FLU/RSV testing.  Fact Sheet for Patients: bloggercourse.com  Fact Sheet for Healthcare Providers: seriousbroker.it  This test is not yet approved or cleared by the [place]  FDA and has been authorized for detection and/or diagnosis of SARS-CoV-2 by FDA under an Emergency Use Authorization (EUA). This EUA will remain in effect (meaning this test can be used) for the duration of the COVID-19 declaration under Section 564(b)(1) of the Act, 21 U.S.C. section 360bbb-3(b)(1), unless the authorization is terminated or revoked.     Resp Syncytial Virus by PCR POSITIVE (A) NEGATIVE Final    Comment: (NOTE) Fact Sheet for Patients: bloggercourse.com  Fact Sheet for Healthcare Providers: seriousbroker.it  This test is not yet approved or cleared by the [place]  FDA and has been authorized for detection and/or diagnosis of SARS-CoV-2 by FDA under an Emergency Use  Authorization (EUA). This EUA will remain in effect (meaning this test can be used) for the duration of the COVID-19 declaration under Section 564(b)(1) of the Act, 21 U.S.C. section 360bbb-3(b)(1), unless the authorization is terminated or revoked.  Performed at [facility], [address] [place], [place] [address]   Blood Culture (routine x 2)     Status: None (Preliminary result)   Collection Time: 08/13/24 10:37 AM   Specimen: BLOOD  Result Value Ref Range Status   Specimen Description BLOOD BLOOD RIGHT ARM  Final   Special Requests   Final    BOTTLES DRAWN AEROBIC AND ANAEROBIC Blood Culture results may not be optimal due to an inadequate volume of blood received in culture bottles   Culture   Final    NO GROWTH < 24 HOURS Performed at [facility], [address], [place], [place] [address]    Report Status PENDING  Incomplete  Blood Culture (routine x 2)     Status: None (Preliminary result)   Collection Time: 08/13/24 10:38 AM   Specimen: BLOOD  Result Value Ref Range Status   Specimen Description BLOOD BLOOD LEFT ARM  Final   Special Requests   Final    BOTTLES DRAWN AEROBIC AND ANAEROBIC Blood Culture adequate volume   Culture   Final    NO GROWTH < 24 HOURS Performed at [facility], [address], [place], [place] [address]    Report Status PENDING  Incomplete  Expectorated Sputum Assessment w Gram Stain, Rflx to Resp Cult     Status: None   Collection Time: 08/13/24  4:25 PM   Specimen: Sputum  Result Value Ref Range Status   Specimen Description SPUTUM  Final   Special Requests NONE  Final   Sputum evaluation   Final    THIS SPECIMEN IS ACCEPTABLE FOR SPUTUM CULTURE Performed at [facility], [address] [place], [place] [address]    Report Status 08/13/2024 FINAL  Final  Culture, Respiratory w Gram Stain  Status: None (Preliminary result)   Collection Time: 08/13/24  4:25 PM   Specimen: SPU  Result Value  Ref Range Status   Specimen Description   Final    SPUTUM Performed at [facility], [address] [place], [place] [address]    Special Requests   Final    NONE Reflexed from [id number] Performed at [facility], [address] [place], [place] [address]    Gram Stain   Final    FEW WBC PRESENT, PREDOMINANTLY PMN FEW GRAM POSITIVE COCCI RARE GRAM POSITIVE RODS RARE GRAM NEGATIVE RODS    Culture   Final    TOO YOUNG TO READ Performed at [facility], [address] [place], [place] [address]    Report Status PENDING  Incomplete      Radiology Studies last 3 days: DG Chest Port 1 View Result Date: 08/13/2024 EXAM: 1 VIEW(S) XRAY OF THE CHEST 08/13/2024 10:42:00 AM COMPARISON: 07/29/2024 and 07/23/2024. CLINICAL HISTORY: 64 year old male. Questionable sepsis. Evaluate for abnormality. FINDINGS: LUNGS AND PLEURA: Chronic lung disease with bullous emphysema, chronic lung nodules on prior CT in 2020. Persistent bibasilar patchy airspace opacities, asymmetric and lower lung predominant, are new or progressed since 07/23/2024 Right apical scarring. No consolidation. No pleural effusion. No pneumothorax. HEART AND MEDIASTINUM: No acute abnormality of the cardiac and mediastinal silhouettes. BONES AND SOFT TISSUES: Partially visualized cervical spinal fusion hardware in place. No acute osseous abnormality. IMPRESSION: 1. Chronic lung disease with evidence of basilar predominant acute infectious exacerbation. 2. Emphysema (ICD10-J43.9). Electronically signed by: Helayne Hurst MD 08/13/2024 10:58 AM EST RP Workstation: [id number]         Laneta Blunt, DO [facility] 08/14/2024, 2:51 PM    Dictation software may have been used to generate the above note. Typos may occur and escape review in typed/dictated notes. Please contact Dr Blunt directly for clarity if needed.  Staff may message me via secure chat in Epic  but this may not receive an  immediate response,  please page me for urgent matters!  If 7PM-7AM, please contact night coverage [email or web address]       "

## 2024-08-15 DIAGNOSIS — J441 Chronic obstructive pulmonary disease with (acute) exacerbation: Secondary | ICD-10-CM | POA: Diagnosis not present

## 2024-08-15 LAB — CBC
HCT: 37.4 % — ABNORMAL LOW (ref 39.0–52.0)
Hemoglobin: 12 g/dL — ABNORMAL LOW (ref 13.0–17.0)
MCH: 31.3 pg (ref 26.0–34.0)
MCHC: 32.1 g/dL (ref 30.0–36.0)
MCV: 97.7 fL (ref 80.0–100.0)
Platelets: 209 10*3/uL (ref 150–400)
RBC: 3.83 MIL/uL — ABNORMAL LOW (ref 4.22–5.81)
RDW: 12.9 % (ref 11.5–15.5)
WBC: 14.9 10*3/uL — ABNORMAL HIGH (ref 4.0–10.5)
nRBC: 0 % (ref 0.0–0.2)

## 2024-08-15 LAB — BASIC METABOLIC PANEL WITH GFR
Anion gap: 8 (ref 5–15)
BUN: 19 mg/dL (ref 8–23)
CO2: 25 mmol/L (ref 22–32)
Calcium: 8.4 mg/dL — ABNORMAL LOW (ref 8.9–10.3)
Chloride: 109 mmol/L (ref 98–111)
Creatinine, Ser: 1.08 mg/dL (ref 0.61–1.24)
GFR, Estimated: >60 mL/min
Glucose, Bld: 98 mg/dL (ref 70–99)
Potassium: 4.6 mmol/L (ref 3.5–5.1)
Sodium: 142 mmol/L (ref 135–145)

## 2024-08-15 LAB — LACTIC ACID, PLASMA: Lactic Acid, Venous: 1.3 mmol/L (ref 0.5–1.9)

## 2024-08-15 MED ORDER — IPRATROPIUM-ALBUTEROL 0.5-2.5 (3) MG/3ML IN SOLN
3.0000 mL | Freq: Two times a day (BID) | RESPIRATORY_TRACT | Status: DC
  Administered 2024-08-15 – 2024-08-16 (×2): 3 mL via RESPIRATORY_TRACT
  Filled 2024-08-15 (×2): qty 3

## 2024-08-15 NOTE — Progress Notes (Signed)
 Patient had a BM on 1/24. Refused colace or any laxatives to help him have a BM

## 2024-08-15 NOTE — TOC CM/SW Note (Signed)
 Transition of Care [facility]) - Inpatient Brief Assessment   Patient Details  Name: Ian Taylor MRN: [id number] Date of Birth: 1960/10/07  Transition of Care [facility]) CM/SW Contact:    Nathanael CHRISTELLA Ring, RN Phone Number: 08/15/2024, 9:41 AM   Clinical Narrative:  Transition of Care [facility]) Screening Note   Patient Details  Name: Ian Taylor Date of Birth: [birth year]   Transition of Care [facility]) CM/SW Contact:    Nathanael CHRISTELLA Ring, RN Phone Number: 08/15/2024, 9:41 AM    Transition of Care Department [facility]) has reviewed patient and no TOC needs have been identified at this time.  If new patient transition needs arise, please place a TOC consult. Currently on RA.       Transition of Care Asessment: Insurance and Status: Insurance coverage has been reviewed Patient has primary care physician: Yes Home environment has been reviewed: From home Prior level of function:: independent Prior/Current Home Services: No current home services Social Drivers of Health Review: SDOH reviewed no interventions necessary Readmission risk has been reviewed: Yes Transition of care needs: no transition of care needs at this time

## 2024-08-15 NOTE — Progress Notes (Signed)
 " Progress Note   Patient: Ian Taylor FMW:[id number] DOB: 26-Apr-1961 DOA: 08/13/2024     1 DOS: the patient was seen and examined on 08/15/2024   Brief hospital course:  HPI: Symptoms started 3 to 4 weeks ago when patient started to have first likely a URI process of runny nose sore throat, headache and congestion, followed by dry cough and productive cough with clear phlegm, last few days has been experiencing more wheezing and increasing exertional dyspnea. Has had cycles of fever and chills. Denies any chest pain no nauseous vomiting no abdominal pain or diarrhea. Claims that he smokes occasionally.    Hospital course / significant events: 01/26: to ED. Temperature 100.2 pulse rate 107, blood pressure 93/70 O2 saturation 95% room air.  Chest x-ray suspicious for bilateral lower field infiltrates, blood work showed WBC 18.5 hemoglobin 15.6 BUN 16 creatinine 1.2 bicarb 22K 4.5.  Nasal swab positive for RSV. Patient was given IV Solu-Medrol , multiple rounds of DuoNebs and antibiotics ceftriaxone  and azithromycin. Remain on 2L O2 overnight 01/27: room air this AM, but back on O2 shortly thereafter, very SOB on ambulation and O2 desat.    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  Clinically patient appears to be euvolemic, no suspicion for CHF. Ceftriaxone  and doxycycline     RSV infection Supportive care - tx CAP  and COPD as above   History of NSCLC status post chemoimmunotherapy 2020-2022 Followed by oncology at [facility], CT chest every 6 months.   Cigarette smoking Cessation education performed at bedside     DVT prophylaxis: lovenox    Code Status: FULL CODE   TOC needs: TBD expect may need home O2 Medical barriers to dispo at this time: O2 requirement. Expected readiness for discharge per TOC at this time:    Subjective / Brief ROS:  Denies nausea vomiting abdominal pain Still has some exertional shortness of breath   Family Communication: none at this time        Objective Findings: Examination:  Physical Exam Constitutional:      General: He is not in acute distress. Cardiovascular:     Rate and Rhythm: Normal rate and regular rhythm.  Pulmonary:     Breath sounds: Decreased breath sounds present.  Musculoskeletal:     Right lower leg: No edema.     Left lower leg: No edema.  Neurological:     Mental Status: He is alert.    Vitals:   08/14/24 1952 08/14/24 1959 08/15/24 0800 08/15/24 1636  BP: 111/72  96/60 119/73  Pulse: 69  75 72  Resp: 18  17 17   Temp: (!) 97.5 F (36.4 C)     TempSrc:      SpO2: 95% 93% 95% 96%  Weight:      Height:        Data Reviewed:    Latest Ref Rng & Units 08/15/2024    5:17 AM 08/14/2024    4:59 AM 08/13/2024   10:38 AM  CBC  WBC 4.0 - 10.5 K/uL 14.9  12.3  18.4   Hemoglobin 13.0 - 17.0 g/dL 87.9  84.6  84.3   Hematocrit 39.0 - 52.0 % 37.4  47.7  47.5   Platelets 150 - 400 K/uL 209  173  288    basic metabolic panel   Author: Drue ONEIDA Potter, MD 08/15/2024 4:38 PM  For on call review [email or web address].  "

## 2024-08-15 NOTE — Plan of Care (Signed)

## 2024-08-16 ENCOUNTER — Other Ambulatory Visit: Payer: Self-pay

## 2024-08-16 DIAGNOSIS — J441 Chronic obstructive pulmonary disease with (acute) exacerbation: Secondary | ICD-10-CM | POA: Diagnosis not present

## 2024-08-16 LAB — BASIC METABOLIC PANEL WITH GFR
Anion gap: 8 (ref 5–15)
BUN: 16 mg/dL (ref 8–23)
CO2: 27 mmol/L (ref 22–32)
Calcium: 8.9 mg/dL (ref 8.9–10.3)
Chloride: 105 mmol/L (ref 98–111)
Creatinine, Ser: 1.05 mg/dL (ref 0.61–1.24)
GFR, Estimated: >60 mL/min
Glucose, Bld: 88 mg/dL (ref 70–99)
Potassium: 4.1 mmol/L (ref 3.5–5.1)
Sodium: 140 mmol/L (ref 135–145)

## 2024-08-16 LAB — CBC WITH DIFFERENTIAL/PLATELET
Abs Immature Granulocytes: 0.05 10*3/uL (ref 0.00–0.07)
Basophils Absolute: 0 10*3/uL (ref 0.0–0.1)
Basophils Relative: 0 %
Eosinophils Absolute: 0 10*3/uL (ref 0.0–0.5)
Eosinophils Relative: 0 %
HCT: 38.2 % — ABNORMAL LOW (ref 39.0–52.0)
Hemoglobin: 12.2 g/dL — ABNORMAL LOW (ref 13.0–17.0)
Immature Granulocytes: 1 %
Lymphocytes Relative: 22 %
Lymphs Abs: 2.2 10*3/uL (ref 0.7–4.0)
MCH: 31.3 pg (ref 26.0–34.0)
MCHC: 31.9 g/dL (ref 30.0–36.0)
MCV: 97.9 fL (ref 80.0–100.0)
Monocytes Absolute: 0.7 10*3/uL (ref 0.1–1.0)
Monocytes Relative: 7 %
Neutro Abs: 7.2 10*3/uL (ref 1.7–7.7)
Neutrophils Relative %: 70 %
Platelets: 227 10*3/uL (ref 150–400)
RBC: 3.9 MIL/uL — ABNORMAL LOW (ref 4.22–5.81)
RDW: 13.1 % (ref 11.5–15.5)
WBC: 10.2 10*3/uL (ref 4.0–10.5)
nRBC: 0 % (ref 0.0–0.2)

## 2024-08-16 LAB — MISC LABCORP TEST (SEND OUT): Labcorp test code: 83935

## 2024-08-16 LAB — CULTURE, RESPIRATORY W GRAM STAIN: Culture: NORMAL

## 2024-08-16 MED ORDER — DOXYCYCLINE HYCLATE 100 MG PO TABS: 100.0000 mg | ORAL_TABLET | Freq: Two times a day (BID) | ORAL | Status: DC

## 2024-08-16 MED ORDER — GUAIFENESIN ER 600 MG PO TB12
600.0000 mg | ORAL_TABLET | Freq: Two times a day (BID) | ORAL | 0 refills | Status: AC
  Filled 2024-08-16: qty 10, 5d supply, fill #0

## 2024-08-16 MED ORDER — AMOXICILLIN-POT CLAVULANATE 875-125 MG PO TABS
1.0000 | ORAL_TABLET | Freq: Two times a day (BID) | ORAL | 0 refills | Status: AC
  Filled 2024-08-16: qty 10, 5d supply, fill #0

## 2024-08-16 MED ORDER — ALBUTEROL SULFATE HFA 108 (90 BASE) MCG/ACT IN AERS
2.0000 | INHALATION_SPRAY | RESPIRATORY_TRACT | 3 refills | Status: AC | PRN
  Filled 2024-08-16 (×2): qty 60.3, 17d supply, fill #0

## 2024-08-16 MED ORDER — ALBUTEROL-BUDESONIDE 90-80 MCG/ACT IN AERO
2.0000 | INHALATION_SPRAY | Freq: Two times a day (BID) | RESPIRATORY_TRACT | 3 refills | Status: AC
  Filled 2024-08-16: qty 21.4, 60d supply, fill #0

## 2024-08-16 MED ORDER — PREDNISONE 20 MG PO TABS
40.0000 mg | ORAL_TABLET | Freq: Every day | ORAL | 0 refills | Status: AC
  Filled 2024-08-16: qty 6, 3d supply, fill #0

## 2024-08-16 MED ORDER — DOCUSATE SODIUM 100 MG PO CAPS
100.0000 mg | ORAL_CAPSULE | Freq: Two times a day (BID) | ORAL | 0 refills | Status: AC | PRN
  Filled 2024-08-16: qty 10, 5d supply, fill #0

## 2024-08-16 NOTE — Plan of Care (Signed)
  Problem: Education: Goal: Knowledge of General Education information will improve Description: Including pain rating scale, medication(s)/side effects and non-pharmacologic comfort measures Outcome: Progressing   Problem: Clinical Measurements: Goal: Ability to maintain clinical measurements within normal limits will improve Outcome: Progressing   Problem: Nutrition: Goal: Adequate nutrition will be maintained Outcome: Progressing   Problem: Elimination: Goal: Will not experience complications related to bowel motility Outcome: Progressing   Problem: Skin Integrity: Goal: Risk for impaired skin integrity will decrease Outcome: Progressing

## 2024-08-16 NOTE — Plan of Care (Signed)

## 2024-08-16 NOTE — Progress Notes (Signed)
 PHARMACIST - PHYSICIAN COMMUNICATION  CONCERNING: Antibiotic IV to Oral Route Change Policy  RECOMMENDATION: This patient is receiving doxycycline  by the intravenous route.  Based on criteria approved by the Pharmacy and Therapeutics Committee, the antibiotic(s) is/are being converted to the equivalent oral dose form(s).  DESCRIPTION: These criteria include: Patient being treated for a respiratory tract infection, urinary tract infection, cellulitis or clostridium difficile associated diarrhea if on metronidazole The patient is not neutropenic and does not exhibit a GI malabsorption state The patient is eating (either orally or via tube) and/or has been taking other orally administered medications for a least 24 hours The patient is improving clinically and has a Tmax < 100.5  If you have questions about this conversion, please contact the Pharmacy Department  []   [phone] )  [facility] [x]   [phone] )  [facility] []   [phone] )  [facility] []   [phone] )  [facility] []   [phone] )  [facility]    Thank you for involving pharmacy in this patient's care.   Damien Napoleon, PharmD Clinical Pharmacist 08/16/2024 9:06 AM

## 2024-08-16 NOTE — Discharge Summary (Signed)
 " Physician Discharge Summary   Patient: Ian Taylor MRN: [id number] DOB: [birth year]  Admit date:     08/13/2024  Discharge date: 08/16/24  Discharge Physician: Drue ONEIDA Potter   PCP: Donal Channing SQUIBB, FNP   Recommendations at discharge:  Follow-up with PCP  Discharge Diagnoses: Principal Problem:   Pneumonia Active Problems:   COPD (chronic obstructive pulmonary disease) (HCC)   CAP (community acquired pneumonia)  Resolved Problems:   * No resolved hospital problems. *  Hospital Course:  HPI: Symptoms started 3 to 4 weeks ago when patient started to have first likely a URI process of runny nose sore throat, headache and congestion, followed by dry cough and productive cough with clear phlegm, last few days has been experiencing more wheezing and increasing exertional dyspnea. Has had cycles of fever and chills. Denies any chest pain no nauseous vomiting no abdominal pain or diarrhea. Claims that he smokes occasionally.    Hospital course / significant events: 01/26: to ED. Temperature 100.2 pulse rate 107, blood pressure 93/70 O2 saturation 95% room air.  Chest x-ray suspicious for bilateral lower field infiltrates, blood work showed WBC 18.5 hemoglobin 15.6 BUN 16 creatinine 1.2 bicarb 22K 4.5.  Nasal swab positive for RSV. Patient was given IV Solu-Medrol , multiple rounds of DuoNebs and antibiotics ceftriaxone  and azithromycin. Remain on 2L O2 overnight 01/27: room air this AM, but back on O2 shortly thereafter, very SOB on ambulation and O2 desat.   Assessment and Plan:  Acute COPD exacerbation Acute hypoxic respiratory failure, suspect chronic component  Complete steroid course Continue nebulization Patient underwent a walk test today that showed he saturating 95 to 96% on room air Patient is therefore being discharged today to follow-up with PCP   CAP, bacterial Severe Sepsis with end organ damage - hypoxia Patient will complete a course of antibiotics   RSV  infection Supportive care - tx CAP and COPD as above   History of NSCLC status post chemoimmunotherapy 2020-2022 Followed by oncology at [facility], CT chest every 6 months.   Cigarette smoking   Consultants: None Procedures performed: none  Disposition: Home Diet recommendation:  Cardiac diet DISCHARGE MEDICATION: Allergies as of 08/16/2024   No Known Allergies      Medication List     STOP taking these medications    benzonatate  100 MG capsule Commonly known as: Tessalon  Perles   Breztri Aerosphere 160-9-4.8 MCG/ACT Aero inhaler Generic drug: budesonide -glycopyrrolate -formoterol   naproxen sodium 220 MG tablet Commonly known as: ALEVE   oxyCODONE -acetaminophen  5-325 MG tablet Commonly known as: Roxicet   Probiotic & Acidophilus Ex St Caps       TAKE these medications    acetaminophen  325 MG tablet Commonly known as: TYLENOL  Take 650 mg by mouth every 6 (six) hours as needed.   albuterol  108 (90 Base) MCG/ACT inhaler Commonly known as: VENTOLIN  HFA Inhale 2 puffs into the lungs every 4 (four) hours as needed.   Albuterol -Budesonide  90-80 MCG/ACT Aero Inhale 2 puffs into the lungs 2 (two) times daily. What changed:  how much to take when to take this reasons to take this   amoxicillin -clavulanate 875-125 MG tablet Commonly known as: AUGMENTIN  Take 1 tablet by mouth 2 (two) times daily for 5 days.   chlorpheniramine-HYDROcodone 10-8 MG/5ML Suer Commonly known as: Tussionex Pennkinetic ER Take 5 mLs by mouth every 12 (twelve) hours as needed for cough.   docusate sodium  100 MG capsule Commonly known as: COLACE Take 1 capsule (100 mg total) by mouth  2 (two) times daily as needed for mild constipation.   guaiFENesin  600 MG 12 hr tablet Commonly known as: MUCINEX  Take 1 tablet (600 mg total) by mouth 2 (two) times daily for 5 days.   predniSONE  20 MG tablet Commonly known as: DELTASONE  Take 2 tablets (40 mg total) by mouth daily with breakfast  for 3 days. Start taking on: August 17, 2024        Discharge Exam: Fredricka Weights   08/13/24 1005  Weight: 67.1 kg   Cardiovascular:     Rate and Rhythm: Normal rate and regular rhythm.  Pulmonary: Clear to auscultation bilaterally Musculoskeletal:     Right lower leg: No edema.     Left lower leg: No edema.  Neurological:     Mental Status: He is alert.   Condition at discharge: good  The results of significant diagnostics from this hospitalization (including imaging, microbiology, ancillary and laboratory) are listed below for reference.   Imaging Studies: DG Chest Port 1 View Result Date: 08/13/2024 EXAM: 1 VIEW(S) XRAY OF THE CHEST 08/13/2024 10:42:00 AM COMPARISON: 07/29/2024 and 07/23/2024. CLINICAL HISTORY: 64 year old male. Questionable sepsis. Evaluate for abnormality. FINDINGS: LUNGS AND PLEURA: Chronic lung disease with bullous emphysema, chronic lung nodules on prior CT in 2020. Persistent bibasilar patchy airspace opacities, asymmetric and lower lung predominant, are new or progressed since 07/23/2024 Right apical scarring. No consolidation. No pleural effusion. No pneumothorax. HEART AND MEDIASTINUM: No acute abnormality of the cardiac and mediastinal silhouettes. BONES AND SOFT TISSUES: Partially visualized cervical spinal fusion hardware in place. No acute osseous abnormality. IMPRESSION: 1. Chronic lung disease with evidence of basilar predominant acute infectious exacerbation. 2. Emphysema (ICD10-J43.9). Electronically signed by: Helayne Hurst MD 08/13/2024 10:58 AM EST RP Workstation: [id number]   DG Chest Port 1 View if patient is in a treatment room. Result Date: 07/29/2024 CLINICAL DATA:  Shortness of breath for 1 week.  Sepsis. EXAM: PORTABLE CHEST 1 VIEW COMPARISON:  07/23/2024 FINDINGS: The heart size and mediastinal contours are within normal limits. Pulmonary emphysema again noted. Scarring again seen in the right upper lobe. No evidence of acute infiltrate or  pleural effusion. IMPRESSION: Emphysema and right upper lobe scarring. No acute findings. Electronically Signed   By: Norleen DELENA Kil M.D.   On: 07/29/2024 11:11   DG Chest 2 View Result Date: 07/23/2024 EXAM: 2 VIEW(S) XRAY OF THE CHEST 07/23/2024 02:01:00 PM COMPARISON: 02/11/2024 CLINICAL HISTORY: sob, history of lung ca FINDINGS: LUNGS AND PLEURA: Right apical pleuroparenchymal scarring. Emphysema. Stable nodular density at left apex. No pleural effusion. No pneumothorax. HEART AND MEDIASTINUM: No acute abnormality of the cardiac and mediastinal silhouettes. BONES AND SOFT TISSUES: Cervical fixation hardware. Thoracic spondylosis. No acute osseous abnormality. IMPRESSION: 1. Stable nodular density at the left apex. 2. Right apical pleuroparenchymal scarring and emphysema. Electronically signed by: Dayne Hassell MD 07/23/2024 02:35 PM EST RP Workstation: [id number]    Microbiology: Results for orders placed or performed during the hospital encounter of 08/13/24  Resp panel by RT-PCR (RSV, Flu A&B, Covid) Anterior Nasal Swab     Status: Abnormal   Collection Time: 08/13/24 10:17 AM   Specimen: Anterior Nasal Swab  Result Value Ref Range Status   SARS Coronavirus 2 by RT PCR NEGATIVE NEGATIVE Final    Comment: (NOTE) SARS-CoV-2 target nucleic acids are NOT DETECTED.  The SARS-CoV-2 RNA is generally detectable in upper respiratory specimens during the acute phase of infection. The lowest concentration of SARS-CoV-2 viral copies this assay can detect is 138 copies/mL.  A negative result does not preclude SARS-Cov-2 infection and should not be used as the sole basis for treatment or other patient management decisions. A negative result may occur with  improper specimen collection/handling, submission of specimen other than nasopharyngeal swab, presence of viral mutation(s) within the areas targeted by this assay, and inadequate number of viral copies(<138 copies/mL). A negative result must be  combined with clinical observations, patient history, and epidemiological information. The expected result is Negative.  Fact Sheet for Patients:  bloggercourse.com  Fact Sheet for Healthcare Providers:  seriousbroker.it  This test is no t yet approved or cleared by the [place]  FDA and  has been authorized for detection and/or diagnosis of SARS-CoV-2 by FDA under an Emergency Use Authorization (EUA). This EUA will remain  in effect (meaning this test can be used) for the duration of the COVID-19 declaration under Section 564(b)(1) of the Act, 21 U.S.C.section 360bbb-3(b)(1), unless the authorization is terminated  or revoked sooner.       Influenza A by PCR NEGATIVE NEGATIVE Final   Influenza B by PCR NEGATIVE NEGATIVE Final    Comment: (NOTE) The Xpert Xpress SARS-CoV-2/FLU/RSV plus assay is intended as an aid in the diagnosis of influenza from Nasopharyngeal swab specimens and should not be used as a sole basis for treatment. Nasal washings and aspirates are unacceptable for Xpert Xpress SARS-CoV-2/FLU/RSV testing.  Fact Sheet for Patients: bloggercourse.com  Fact Sheet for Healthcare Providers: seriousbroker.it  This test is not yet approved or cleared by the [place]  FDA and has been authorized for detection and/or diagnosis of SARS-CoV-2 by FDA under an Emergency Use Authorization (EUA). This EUA will remain in effect (meaning this test can be used) for the duration of the COVID-19 declaration under Section 564(b)(1) of the Act, 21 U.S.C. section 360bbb-3(b)(1), unless the authorization is terminated or revoked.     Resp Syncytial Virus by PCR POSITIVE (A) NEGATIVE Final    Comment: (NOTE) Fact Sheet for Patients: bloggercourse.com  Fact Sheet for Healthcare Providers: seriousbroker.it  This test is not  yet approved or cleared by the [place]  FDA and has been authorized for detection and/or diagnosis of SARS-CoV-2 by FDA under an Emergency Use Authorization (EUA). This EUA will remain in effect (meaning this test can be used) for the duration of the COVID-19 declaration under Section 564(b)(1) of the Act, 21 U.S.C. section 360bbb-3(b)(1), unless the authorization is terminated or revoked.  Performed at [facility], [address] [place], [place] [address]   Blood Culture (routine x 2)     Status: None (Preliminary result)   Collection Time: 08/13/24 10:37 AM   Specimen: BLOOD  Result Value Ref Range Status   Specimen Description BLOOD BLOOD RIGHT ARM  Final   Special Requests   Final    BOTTLES DRAWN AEROBIC AND ANAEROBIC Blood Culture results may not be optimal due to an inadequate volume of blood received in culture bottles   Culture   Final    NO GROWTH 3 DAYS Performed at [facility], [address] [place], [place] [address]    Report Status PENDING  Incomplete  Blood Culture (routine x 2)     Status: None (Preliminary result)   Collection Time: 08/13/24 10:38 AM   Specimen: BLOOD  Result Value Ref Range Status   Specimen Description BLOOD BLOOD LEFT ARM  Final   Special Requests   Final    BOTTLES DRAWN AEROBIC AND ANAEROBIC Blood Culture adequate volume   Culture  Final    NO GROWTH 3 DAYS Performed at [facility], [address] [place], [place] [address]    Report Status PENDING  Incomplete  Expectorated Sputum Assessment w Gram Stain, Rflx to Resp Cult     Status: None   Collection Time: 08/13/24  4:25 PM   Specimen: Sputum  Result Value Ref Range Status   Specimen Description SPUTUM  Final   Special Requests NONE  Final   Sputum evaluation   Final    THIS SPECIMEN IS ACCEPTABLE FOR SPUTUM CULTURE Performed at [facility], [address], [place], [place] [address]    Report Status 08/13/2024 FINAL  Final   Culture, Respiratory w Gram Stain     Status: None   Collection Time: 08/13/24  4:25 PM   Specimen: SPU  Result Value Ref Range Status   Specimen Description   Final    SPUTUM Performed at [facility], [address] [place], [place] [address]    Special Requests   Final    NONE Reflexed from [phone] Performed at [facility], [address] [place], [place] [address]    Gram Stain   Final    FEW WBC PRESENT, PREDOMINANTLY PMN FEW GRAM POSITIVE COCCI RARE GRAM POSITIVE RODS RARE GRAM NEGATIVE RODS    Culture   Final    MODERATE Normal respiratory flora-no Staph aureus or Pseudomonas seen Performed at [facility], [address], [place], [place] [address]    Report Status 08/16/2024 FINAL  Final    Labs: CBC: Recent Labs  Lab 08/13/24 1038 08/14/24 0459 08/15/24 0517 08/16/24 0821  WBC 18.4* 12.3* 14.9* 10.2  NEUTROABS 15.1*  --   --  7.2  HGB 15.6 15.3 12.0* 12.2*  HCT 47.5 47.7 37.4* 38.2*  MCV 95.0 97.9 97.7 97.9  PLT 288 173 209 227   Basic Metabolic Panel: Recent Labs  Lab 08/13/24 1038 08/15/24 0517 08/16/24 1021  NA 133* 142 140  K 4.5 4.6 4.1  CL 98 109 105  CO2 22 25 27   GLUCOSE 89 98 88  BUN 16 19 16   CREATININE 1.29* 1.08 1.05  CALCIUM 9.2 8.4* 8.9   Liver Function Tests: Recent Labs  Lab 08/13/24 1038  AST 20  ALT 17  ALKPHOS 87  BILITOT 0.8  PROT 7.3  ALBUMIN 4.1   CBG: No results for input(s): GLUCAP in the last 168 hours.  Discharge time spent:  35 minutes.  Signed: Drue ONEIDA Potter, MD [facility] 08/16/2024 "

## 2024-08-17 ENCOUNTER — Other Ambulatory Visit: Payer: Self-pay

## 2024-08-18 LAB — CULTURE, BLOOD (ROUTINE X 2)
Culture: NO GROWTH
Culture: NO GROWTH
Special Requests: ADEQUATE
